# Patient Record
Sex: Female | Born: 1935 | Race: Black or African American | Hispanic: No | Smoking: Never smoker
Health system: Southern US, Community
[De-identification: ages and names within clinical notes are randomized; demographics above are authoritative.]

## PROBLEM LIST (undated history)

## (undated) DIAGNOSIS — J45909 Unspecified asthma, uncomplicated: Secondary | ICD-10-CM

## (undated) DIAGNOSIS — I10 Essential (primary) hypertension: Secondary | ICD-10-CM

## (undated) DIAGNOSIS — R413 Other amnesia: Secondary | ICD-10-CM

## (undated) DIAGNOSIS — F329 Major depressive disorder, single episode, unspecified: Secondary | ICD-10-CM

## (undated) DIAGNOSIS — F32A Depression, unspecified: Secondary | ICD-10-CM

## (undated) HISTORY — DX: Unspecified asthma, uncomplicated: J45.909

## (undated) HISTORY — DX: Major depressive disorder, single episode, unspecified: F32.9

## (undated) HISTORY — DX: Depression, unspecified: F32.A

## (undated) HISTORY — DX: Essential (primary) hypertension: I10

## (undated) HISTORY — PX: ABDOMINAL HYSTERECTOMY: SHX81

## (undated) HISTORY — DX: Other amnesia: R41.3

---

## 2005-10-15 ENCOUNTER — Emergency Department: Payer: Self-pay | Admitting: Emergency Medicine

## 2006-06-12 ENCOUNTER — Ambulatory Visit: Payer: Self-pay

## 2007-04-07 ENCOUNTER — Inpatient Hospital Stay: Payer: Self-pay | Admitting: Specialist

## 2007-04-07 ENCOUNTER — Other Ambulatory Visit: Payer: Self-pay

## 2007-04-22 ENCOUNTER — Ambulatory Visit: Payer: Self-pay | Admitting: Obstetrics and Gynecology

## 2008-04-24 ENCOUNTER — Ambulatory Visit: Payer: Self-pay | Admitting: Family Medicine

## 2009-03-16 ENCOUNTER — Emergency Department: Payer: Self-pay | Admitting: Internal Medicine

## 2009-04-26 ENCOUNTER — Ambulatory Visit: Payer: Self-pay | Admitting: Family Medicine

## 2009-08-16 ENCOUNTER — Emergency Department: Payer: Self-pay | Admitting: Unknown Physician Specialty

## 2010-04-28 ENCOUNTER — Ambulatory Visit: Payer: Self-pay | Admitting: Family Medicine

## 2011-01-02 ENCOUNTER — Emergency Department: Payer: Self-pay | Admitting: Emergency Medicine

## 2011-01-07 ENCOUNTER — Emergency Department: Payer: Self-pay

## 2011-05-27 ENCOUNTER — Ambulatory Visit: Payer: Self-pay | Admitting: Family Medicine

## 2011-07-14 HISTORY — PX: UPPER GI ENDOSCOPY: SHX6162

## 2011-07-14 HISTORY — PX: COLONOSCOPY: SHX174

## 2011-10-29 ENCOUNTER — Ambulatory Visit: Payer: Self-pay | Admitting: Unknown Physician Specialty

## 2011-11-03 LAB — PATHOLOGY REPORT

## 2012-02-12 ENCOUNTER — Emergency Department: Payer: Self-pay | Admitting: Emergency Medicine

## 2012-02-12 LAB — CBC
HGB: 11.2 g/dL — ABNORMAL LOW (ref 12.0–16.0)
MCH: 25.2 pg — ABNORMAL LOW (ref 26.0–34.0)
Platelet: 232 10*3/uL (ref 150–440)
RBC: 4.44 10*6/uL (ref 3.80–5.20)
WBC: 5.7 10*3/uL (ref 3.6–11.0)

## 2012-02-12 LAB — TROPONIN I: Troponin-I: <0.02 ng/mL

## 2012-02-12 LAB — BASIC METABOLIC PANEL
Anion Gap: 8 (ref 7–16)
BUN: 32 mg/dL — ABNORMAL HIGH (ref 7–18)
Calcium, Total: 9.9 mg/dL (ref 8.5–10.1)
Co2: 24 mmol/L (ref 21–32)
Creatinine: 1.24 mg/dL (ref 0.60–1.30)
EGFR (African American): 47 — ABNORMAL LOW
EGFR (Non-African Amer.): 40 — ABNORMAL LOW
Glucose: 80 mg/dL (ref 65–99)
Potassium: 4.3 mmol/L (ref 3.5–5.1)
Sodium: 141 mmol/L (ref 136–145)

## 2012-10-07 ENCOUNTER — Emergency Department: Payer: Self-pay | Admitting: Unknown Physician Specialty

## 2012-10-07 LAB — URINALYSIS, COMPLETE
Bacteria: NONE SEEN
Bilirubin,UR: NEGATIVE
Glucose,UR: NEGATIVE mg/dL (ref 0–75)
Leukocyte Esterase: NEGATIVE
Nitrite: NEGATIVE
Ph: 5 (ref 4.5–8.0)
Protein: 100
RBC,UR: 8 /HPF (ref 0–5)
Specific Gravity: 1.023 (ref 1.003–1.030)
Squamous Epithelial: 1
WBC UR: 3 /HPF (ref 0–5)

## 2012-10-07 LAB — COMPREHENSIVE METABOLIC PANEL
Albumin: 3 g/dL — ABNORMAL LOW (ref 3.4–5.0)
Alkaline Phosphatase: 168 U/L — ABNORMAL HIGH (ref 50–136)
Anion Gap: 7 (ref 7–16)
BUN: 14 mg/dL (ref 7–18)
Chloride: 105 mmol/L (ref 98–107)
Co2: 24 mmol/L (ref 21–32)
Creatinine: 1.1 mg/dL (ref 0.60–1.30)
EGFR (African American): 54 — ABNORMAL LOW
Glucose: 116 mg/dL — ABNORMAL HIGH (ref 65–99)
Osmolality: 273 (ref 275–301)
SGOT(AST): 42 U/L — ABNORMAL HIGH (ref 15–37)
SGPT (ALT): 43 U/L (ref 12–78)
Sodium: 136 mmol/L (ref 136–145)

## 2012-10-07 LAB — CK TOTAL AND CKMB (NOT AT ARMC): CK-MB: 1.1 ng/mL (ref 0.5–3.6)

## 2012-10-07 LAB — CBC
HCT: 34.1 % — ABNORMAL LOW (ref 35.0–47.0)
MCH: 25.6 pg — ABNORMAL LOW (ref 26.0–34.0)
MCHC: 32.3 g/dL (ref 32.0–36.0)
RDW: 13.9 % (ref 11.5–14.5)
WBC: 7.7 10*3/uL (ref 3.6–11.0)

## 2012-10-07 LAB — LIPASE, BLOOD: Lipase: 127 U/L (ref 73–393)

## 2012-10-07 LAB — TROPONIN I: Troponin-I: 0.04 ng/mL

## 2012-10-14 DIAGNOSIS — W19XXXA Unspecified fall, initial encounter: Secondary | ICD-10-CM | POA: Insufficient documentation

## 2012-10-14 DIAGNOSIS — E785 Hyperlipidemia, unspecified: Secondary | ICD-10-CM | POA: Insufficient documentation

## 2012-10-14 DIAGNOSIS — I10 Essential (primary) hypertension: Secondary | ICD-10-CM | POA: Insufficient documentation

## 2012-10-14 DIAGNOSIS — Z9889 Other specified postprocedural states: Secondary | ICD-10-CM | POA: Insufficient documentation

## 2012-10-14 DIAGNOSIS — M1711 Unilateral primary osteoarthritis, right knee: Secondary | ICD-10-CM | POA: Insufficient documentation

## 2012-11-23 ENCOUNTER — Emergency Department: Payer: Self-pay | Admitting: Internal Medicine

## 2013-02-17 ENCOUNTER — Ambulatory Visit: Payer: Self-pay | Admitting: Family Medicine

## 2013-04-12 HISTORY — PX: JOINT REPLACEMENT: SHX530

## 2013-04-13 ENCOUNTER — Ambulatory Visit: Payer: Self-pay | Admitting: General Practice

## 2013-04-13 LAB — URINALYSIS, COMPLETE
Bacteria: NONE SEEN
Bilirubin,UR: NEGATIVE
Blood: NEGATIVE
Glucose,UR: NEGATIVE mg/dL (ref 0–75)
Ketone: NEGATIVE
Leukocyte Esterase: NEGATIVE
Nitrite: NEGATIVE
Ph: 5 (ref 4.5–8.0)
Protein: NEGATIVE
Squamous Epithelial: 1

## 2013-04-13 LAB — APTT: Activated PTT: 42.6 secs — ABNORMAL HIGH (ref 23.6–35.9)

## 2013-04-13 LAB — BASIC METABOLIC PANEL
BUN: 22 mg/dL — ABNORMAL HIGH (ref 7–18)
Calcium, Total: 9.7 mg/dL (ref 8.5–10.1)
Chloride: 110 mmol/L — ABNORMAL HIGH (ref 98–107)
Creatinine: 1.09 mg/dL (ref 0.60–1.30)
EGFR (African American): 54 — ABNORMAL LOW
Osmolality: 284 (ref 275–301)
Sodium: 141 mmol/L (ref 136–145)

## 2013-04-13 LAB — CBC
HGB: 11.7 g/dL — ABNORMAL LOW (ref 12.0–16.0)
MCH: 25.2 pg — ABNORMAL LOW (ref 26.0–34.0)
MCHC: 32.3 g/dL (ref 32.0–36.0)
MCV: 78 fL — ABNORMAL LOW (ref 80–100)
Platelet: 213 10*3/uL (ref 150–440)
RDW: 15.9 % — ABNORMAL HIGH (ref 11.5–14.5)

## 2013-04-13 LAB — PROTIME-INR
INR: 1
Prothrombin Time: 13.5 secs (ref 11.5–14.7)

## 2013-04-14 LAB — URINE CULTURE

## 2013-04-26 ENCOUNTER — Inpatient Hospital Stay: Payer: Self-pay | Admitting: General Practice

## 2013-04-26 LAB — SEDIMENTATION RATE: Erythrocyte Sed Rate: 28 mm/hr (ref 0–30)

## 2013-04-27 LAB — BASIC METABOLIC PANEL
Anion Gap: 7 (ref 7–16)
BUN: 13 mg/dL (ref 7–18)
Calcium, Total: 8.5 mg/dL (ref 8.5–10.1)
Chloride: 112 mmol/L — ABNORMAL HIGH (ref 98–107)
Co2: 21 mmol/L (ref 21–32)
Creatinine: 0.98 mg/dL (ref 0.60–1.30)
EGFR (African American): >60
EGFR (Non-African Amer.): 53 — ABNORMAL LOW
Glucose: 97 mg/dL (ref 65–99)
Osmolality: 279 (ref 275–301)
Potassium: 3.9 mmol/L (ref 3.5–5.1)
Sodium: 140 mmol/L (ref 136–145)

## 2013-04-27 LAB — HEMOGLOBIN: HGB: 9.8 g/dL — ABNORMAL LOW (ref 12.0–16.0)

## 2013-04-27 LAB — PLATELET COUNT: Platelet: 177 10*3/uL (ref 150–440)

## 2013-04-28 LAB — BASIC METABOLIC PANEL
Anion Gap: 4 — ABNORMAL LOW (ref 7–16)
Calcium, Total: 8.8 mg/dL (ref 8.5–10.1)
Chloride: 111 mmol/L — ABNORMAL HIGH (ref 98–107)
Co2: 24 mmol/L (ref 21–32)
Potassium: 4.5 mmol/L (ref 3.5–5.1)
Sodium: 139 mmol/L (ref 136–145)

## 2013-04-28 LAB — HEMOGLOBIN: HGB: 9.1 g/dL — ABNORMAL LOW (ref 12.0–16.0)

## 2013-05-05 ENCOUNTER — Emergency Department: Payer: Self-pay | Admitting: Emergency Medicine

## 2013-05-05 LAB — COMPREHENSIVE METABOLIC PANEL
Albumin: 3 g/dL — ABNORMAL LOW (ref 3.4–5.0)
Alkaline Phosphatase: 122 U/L (ref 50–136)
Anion Gap: 7 (ref 7–16)
BUN: 27 mg/dL — ABNORMAL HIGH (ref 7–18)
Chloride: 102 mmol/L (ref 98–107)
EGFR (African American): 44 — ABNORMAL LOW
EGFR (Non-African Amer.): 38 — ABNORMAL LOW
Osmolality: 276 (ref 275–301)
Potassium: 4.6 mmol/L (ref 3.5–5.1)
SGOT(AST): 26 U/L (ref 15–37)
SGPT (ALT): 20 U/L (ref 12–78)
Total Protein: 7.6 g/dL (ref 6.4–8.2)

## 2013-05-05 LAB — CBC
HGB: 8.7 g/dL — ABNORMAL LOW (ref 12.0–16.0)
MCH: 25.3 pg — ABNORMAL LOW (ref 26.0–34.0)
MCV: 79 fL — ABNORMAL LOW (ref 80–100)
Platelet: 370 10*3/uL (ref 150–440)
RBC: 3.44 10*6/uL — ABNORMAL LOW (ref 3.80–5.20)
WBC: 8.7 10*3/uL (ref 3.6–11.0)

## 2013-05-05 LAB — CK TOTAL AND CKMB (NOT AT ARMC)
CK, Total: 95 U/L (ref 21–215)
CK-MB: 1.2 ng/mL (ref 0.5–3.6)

## 2013-05-05 LAB — TROPONIN I: Troponin-I: <0.02 ng/mL

## 2013-09-22 ENCOUNTER — Ambulatory Visit: Payer: Self-pay | Admitting: Unknown Physician Specialty

## 2014-01-02 DIAGNOSIS — M1611 Unilateral primary osteoarthritis, right hip: Secondary | ICD-10-CM | POA: Insufficient documentation

## 2014-01-17 DIAGNOSIS — M25551 Pain in right hip: Secondary | ICD-10-CM | POA: Insufficient documentation

## 2014-04-11 DIAGNOSIS — I10 Essential (primary) hypertension: Secondary | ICD-10-CM | POA: Insufficient documentation

## 2014-04-11 DIAGNOSIS — N183 Chronic kidney disease, stage 3 unspecified: Secondary | ICD-10-CM | POA: Insufficient documentation

## 2014-04-11 DIAGNOSIS — I38 Endocarditis, valve unspecified: Secondary | ICD-10-CM | POA: Insufficient documentation

## 2014-04-11 DIAGNOSIS — E782 Mixed hyperlipidemia: Secondary | ICD-10-CM | POA: Insufficient documentation

## 2014-06-20 DIAGNOSIS — Z862 Personal history of diseases of the blood and blood-forming organs and certain disorders involving the immune mechanism: Secondary | ICD-10-CM | POA: Insufficient documentation

## 2014-09-14 ENCOUNTER — Emergency Department: Payer: Self-pay | Admitting: Emergency Medicine

## 2014-11-02 NOTE — Discharge Summary (Signed)
PATIENT NAME:  [name], [name] MR#:  [id number] DATE OF BIRTH:  04-03-1927  DATE OF ADMISSION:  04/26/2013 DATE OF DISCHARGE:    ADMITTING DIAGNOSIS: Degenerative arthrosis of the right knee.   DISCHARGE DIAGNOSIS: Degenerative arthrosis of the right knee.   HISTORY OF PRESENT ILLNESS:  The patient is an 81 year old female who has been followed at [facility] for progression of right knee discomfort. She had reported a several year history of progressive right knee pain. She did have a remote history of right knee arthroscopy with modest improvement of her symptoms. However, she has had increased pain especially along the lateral aspect of the knee. At the time of surgery, she was using a wheeled walker for ambulation due to the severity of pain. The patient had noticed progressive valgus deformity of the knee. She had not seen any significant improvement in her condition despite the use of anti-inflammatory medications, intra-articular cortisone injections as well as Synvisc injections. The patient states that the pain had increased to the point that it was significantly interfering with her activities of daily living. X-rays taken in [facility] showed narrowing of the lateral cartilage space with bone-on-bone articulation noted as well as being associated with valgus deformity. She was noted to have osteophyte as well as subchondral sclerosis. After discussion of the risks and benefits of surgical intervention, the patient expressed her understanding of the risks and benefits and agreed for plans for surgical intervention.   PROCEDURE: Right total knee arthroplasty using computer-assisted navigation.   ANESTHESIA: Spinal.   SOFT TISSUE RELEASE: Anterior cruciate ligament, posterior cruciate ligament, deep medial collateral ligaments, patellofemoral ligament as well as the posterolateral corner and pie crusting of the iliotibial band.   IMPLANTS UTILIZED: DePuy PFC Sigma size 2.5 posterior  stabilized femoral component (cemented), size 2.5 MBT tibial component (cemented), 35 mm 3 pegged oval dome patella (cemented), and a 10 mm stabilized rotating platform polyethylene insert.   HOSPITAL COURSE: The patient tolerated the procedure very well. She had no complications. She was then taken to the PAC-U where she was stabilized and then transferred to the orthopedic floor. The patient began receiving anticoagulation therapy of Lovenox 30 mg subcutaneous every 12 hours per anesthesia and pharmacy protocol. She was fitted with TED stockings bilaterally. These were allowed to be removed 1 hour per 8 hour shift. The right one was applied on day 2 following removal of the Hemovac and dressing change. The patient was also fitted with the AVI compression foot pumps bilaterally set at 80 mmHg. Her calves have been nontender. There has been no evidence of any DVTs. Negative Homans sign. Heels were elevated off the bed using rolled towels.   Physical therapy was initiated on day 1 for gait training and transfers. She was doing very well. She had no pain but ambulation was decreased due to conditioning. She had good range of motion. Occupational therapy was also initiated on day 1 for ADL and assistive devices.   The patient's vital signs have been stable. She has been afebrile. Hemodynamically she was stable. No transfusions were given other than the Autovac transfusions given the first 6 hours postoperatively.   The patient's IV, Foley and Hemovac were discontinued on day 2 along with a dressing change. The Polar Care was reapplied to the surgical leg, maintaining a temperature of 40 to 50 degrees Fahrenheit. Overall, she has done very well. There has been no hospital complications.   DISPOSITION: The patient is being discharged to skilled nursing facility  in improved stable condition.   DISCHARGE INSTRUCTIONS:  She may weight bear as tolerated. Continue using a walker until cleared by physical therapy  to go to a quad cane. Elevate the heels off the bed. Continue with thigh-high TED stockings bilaterally. These are allowed to be removed 1 hour per 8 hour shift. Encourage cough, deep breathing every 2 hours while awake. Incentive spirometer q.1 hour while awake. Continue Polar Care to the surgical leg, maintaining a temperature of 40 to 50 degrees Fahrenheit.  Change dressing as needed. She is not to take a shower until the staples are removed. She has a follow-up appointment on 10/30 at 9:15. For temperatures greater than 101.5 she is to take Tylenol. Call the clinic if any bright red bleeding from the incision or wound.   DRUG ALLERGIES: ASPIRIN.   MEDICATIONS:  Norvasc 5 mg daily, Celebrex 200 mg b.i.d., vitamin B12 1000 mcg daily, Dulcolax suppositories 10 mg p.r.n. if no results then milk of magnesia or Dulcolax, Senokot-S 1 tablet b.i.d., Flonase nasal spray 2 sprays both nostrils daily, pantoprazole 40 mg daily, Pravachol 40 mg at bedtime, Tylenol 500 to 1000 mg to 4 to 6 hours p.r.n. for temperatures greater than 100.4 or pain. milk of magnesia 30 mL b.i.d., oxycodone 5 to 10 mg every 4 to 6 hours p.r.n. for pain, Ultram 50 to 100 mg every 4 to 6 hours p.r.n. p.r.n. for pain. Enema soapsuds if no results with milk of magnesia or Dulcolax.   PAST MEDICAL HISTORY: 1.  Hyperlipidemia.  2.  Hypertension.  3.  Kidney stones.  4.  Iron deficiency anemia.  5.  Depression.  6.  NSTEMI.   ____________________________ Vance Peper, PA [id number] D: 04/28/2013 08:17:23 ET T: 04/28/2013 08:57:04 ET JOB#: [id number]  cc: Vance Peper, PA, <Dictator> Tracy Gerken PA ELECTRONICALLY SIGNED 05/09/2013 8:23

## 2014-11-02 NOTE — Op Note (Signed)
PATIENT NAME:  [name], [name] MR#:  [id number] DATE OF BIRTH:  15-Oct-1926  DATE OF PROCEDURE:  04/26/2013  PREOPERATIVE DIAGNOSIS: Degenerative arthrosis of the right knee.   POSTOPERATIVE DIAGNOSIS: Degenerative arthrosis of the right knee.   PROCEDURE PERFORMED: Right total knee arthroplasty using computer-assisted navigation.   SURGEON: Skip Estimable, M.D.   ASSISTANT: Vance Peper, P.A., (required to maintain retraction throughout the procedure).   ANESTHESIA: Spinal.   ESTIMATED BLOOD LOSS: 50 mL.   FLUIDS REPLACED: 2000 mL of crystalloid.   TOURNIQUET TIME: 87 minutes.   DRAINS: Two medium drains to reinfusion system.   SOFT TISSUE RELEASES: Anterior cruciate ligament, posterior cruciate ligament, deep medial collateral ligament, patellofemoral ligament, posterolateral corner and pie crusting of the iliotibial band.   IMPLANTS UTILIZED: DePuy PFC Sigma size 2.5 posterior stabilized femoral component (cemented), size 2.5 MBT tibial component (cemented), 35 mm three-peg oval dome patella (cemented), and a 10 mm stabilized rotating platform polyethylene insert.   INDICATIONS FOR SURGERY: The patient is an 81 year old female who has been seen for complaints of progressive right knee pain. X-rays demonstrated severe degenerative changes in tricompartmental fashion with severe valgus deformity. After discussion of the risks and benefits of surgical intervention, the patient expressed understanding of the risks and benefits and agreed with plans for surgical intervention.   PROCEDURE IN DETAIL: The patient was brought into the operating room and, after adequate spinal anesthesia was achieved, a tourniquet was placed on the patient's upper right thigh. The patient's right knee and leg were cleaned and prepped with alcohol and DuraPrep, draped in the usual sterile fashion. A "timeout" was performed as per usual protocol. The right lower extremity was exsanguinated using an Esmarch, and the  tourniquet was inflated to 300 mmHg.   An anterior longitudinal incision was made followed by a standard mid vastus approach. A large effusion was evacuated. The deep fibers of the medial collateral ligament were elevated in a subperiosteal fashion off the medial flare of the tibia so as to maintain a continuous soft-tissue sleeve. The patella was subluxed laterally and the patellofemoral ligament was incised. Inspection of the knee demonstrated severe degenerative changes in tricompartmental fashion with full-thickness loss of articular cartilage to the lateral compartment and erosion to the lateral tibial plateau. Prominent osteophytes were debrided using a rongeur. Anterior and posterior cruciate ligaments were excised.   Two 4.0 mm Schanz pins were inserted into the femur and into the tibia for attachment of the array of trackers used for computer-assisted navigation. Hip center was identified using circumduction technique. Distal landmarks were mapped using the computer. The distal femur and proximal tibia were mapped using the computer. Distal femoral cutting guide was positioned using computer-assisted navigation so as to achieve a 5-degree distal valgus cut. Cut was performed and verified using the computer.   Distal femur was sized and it was felt that a size 2.5 femoral component was appropriate. A size 2.5 cutting guide was positioned and the anterior cut was performed and verified using the computer. This was followed by completion of the posterior and chamfer cuts.   Attention was then directed to the proximal tibia. Medial and lateral menisci were excised. The extramedullary tibial cutting guide was positioned using computer-assisted navigation so as to achieve a 0-degree varus valgus alignment and a 0-degree posterior slope. Cut was performed and verified using the computer.   The proximal tibia was sized and it was felt that the size 2.5 tibial tray was appropriate. Tibial and femoral  trials  were inserted followed by insertion of a 10 mm polyethylene insert (trial). The knee was felt to be tight laterally. Trial component was removed and the knee was brought in full extension and distracted using Moreland retractors. The posterolateral corner was carefully released using a combination of electrocautery and Metzenbaum scissors. This allowed for improved medial and lateral soft-tissue balancing. Trial components were reinserted. IT band was still noted to be extremely tight, and pie crusting of the IT band was performed using the scalpel. This allowed for excellent mediolateral soft-tissue balancing both in full extension and in flexion.   Finally, the patella was cut and prepared so as to accommodate a 35 mm three-peg oval dome patella. Patellar trial was placed and the knee was placed through a range of motion with excellent patellar tracking appreciated. The femoral trial was removed after debridement of posterior osteophytes. Central post hole for the tibial component was reamed followed by insertion of a keel punch. Tibial tray was then removed. Cut surfaces of bone were irrigated with copious amounts of normal saline with antibiotic solution using pulsatile lavage and suctioned dry.   Polymethyl methacrylate cement was prepared in the usual fashion using a vacuum mixer. Cement was applied to the cut surface of the proximal tibia as well as along the undersurface of a size 2.5 MBT tibial component. The tibial component was positioned and impacted into place. Excess cement was removed using [employer] fast streamer. Cement was then applied to the cut surface of the femur as well as along the posterior flanges of a size 2.5 posterior stabilized femoral component. Femoral component was positioned and impacted into place. Excess cement was removed using [employer] fast streamer. A 10 mm polyethylene trial was inserted and the knee was brought in full extension with steady axial compression applied.   Finally,  cement was applied to the backside of a 35 mm three-peg oval dome patella, and the patellar component was positioned and patellar clamp applied. Excess cement was removed using [employer] fast streamer. After adequate curing of cement, the tourniquet was deflated after total tourniquet time of 87 minutes. Hemostasis was achieved using electrocautery. The knee was irrigated with copious amounts of normal saline with antibiotic solution using pulsatile lavage and then suctioned dry. The knee was evaluated for any cement debris, and 20 mL of 1.3% Exparel in 40 mL of normal saline was then injected on the posterior capsule, medial and lateral gutters, and along the arthrotomy site. A 10 mm stabilized rotating platform polyethylene insert was inserted and the knee was placed through a range of motion with excellent patellar tracking appreciated and excellent mediolateral soft-tissue balancing noted. Two medium drains were placed in the wound bed and brought out through a separate stab incision to be attached to a reinfusion system. The medial parapatellar portion of the incision was reapproximated using interrupted sutures of #1 Vicryl. The subcutaneous tissue was approximated in layers using first #0 Vicryl followed by 2-0 Vicryl. Subcutaneous tissue along the incision site was then injected with 30 mL of 0.25% Marcaine with epinephrine. Skin was closed with skin staples. A sterile dressing was applied.   The patient tolerated the procedure well. She was transported to the recovery room in stable condition.   ____________________________ Laurice Record. Holley Bouche., MD jph:np D: 04/26/2013 33:82:50 ET T: 04/26/2013 20:52:57 ET JOB#: [id number]  cc: Laurice Record. Holley Bouche., MD, <Dictator> JAMES P Holley Bouche MD ELECTRONICALLY SIGNED 05/02/2013 22:34

## 2014-11-29 DIAGNOSIS — F32A Depression, unspecified: Secondary | ICD-10-CM | POA: Insufficient documentation

## 2014-11-29 DIAGNOSIS — F329 Major depressive disorder, single episode, unspecified: Secondary | ICD-10-CM | POA: Insufficient documentation

## 2014-11-29 DIAGNOSIS — J3089 Other allergic rhinitis: Secondary | ICD-10-CM | POA: Insufficient documentation

## 2014-12-01 DIAGNOSIS — E538 Deficiency of other specified B group vitamins: Secondary | ICD-10-CM | POA: Insufficient documentation

## 2014-12-01 DIAGNOSIS — J4520 Mild intermittent asthma, uncomplicated: Secondary | ICD-10-CM | POA: Insufficient documentation

## 2014-12-01 DIAGNOSIS — D649 Anemia, unspecified: Secondary | ICD-10-CM | POA: Insufficient documentation

## 2015-04-03 ENCOUNTER — Emergency Department
Admission: EM | Admit: 2015-04-03 | Discharge: 2015-04-03 | Disposition: A | Discharge status: Home / Self Care | Payer: Commercial Managed Care - HMO | Attending: Emergency Medicine | Admitting: Emergency Medicine

## 2015-04-03 ENCOUNTER — Emergency Department: Payer: Commercial Managed Care - HMO

## 2015-04-03 ENCOUNTER — Encounter: Payer: Self-pay | Admitting: Emergency Medicine

## 2015-04-03 DIAGNOSIS — Y998 Other external cause status: Secondary | ICD-10-CM | POA: Insufficient documentation

## 2015-04-03 DIAGNOSIS — Y9289 Other specified places as the place of occurrence of the external cause: Secondary | ICD-10-CM | POA: Insufficient documentation

## 2015-04-03 DIAGNOSIS — S6992XA Unspecified injury of left wrist, hand and finger(s), initial encounter: Secondary | ICD-10-CM | POA: Diagnosis present

## 2015-04-03 DIAGNOSIS — Z79899 Other long term (current) drug therapy: Secondary | ICD-10-CM | POA: Diagnosis not present

## 2015-04-03 DIAGNOSIS — Y9389 Activity, other specified: Secondary | ICD-10-CM | POA: Insufficient documentation

## 2015-04-03 DIAGNOSIS — W07XXXA Fall from chair, initial encounter: Secondary | ICD-10-CM | POA: Diagnosis not present

## 2015-04-03 DIAGNOSIS — S63611A Unspecified sprain of left index finger, initial encounter: Secondary | ICD-10-CM | POA: Insufficient documentation

## 2015-04-03 MED ORDER — TRAMADOL HCL 50 MG PO TABS
50.0000 mg | ORAL_TABLET | Freq: Once | ORAL | Status: AC
  Administered 2015-04-03: 50 mg via ORAL
  Filled 2015-04-03: qty 1

## 2015-04-03 MED ORDER — TRAMADOL HCL 50 MG PO TABS: 50.0000 mg | ORAL_TABLET | Freq: Two times a day (BID) | ORAL | Status: DC | PRN

## 2015-04-03 NOTE — ED Notes (Signed)
Patient to ER for pain to left hand. Denies any injury.

## 2015-04-03 NOTE — ED Notes (Signed)
Woke up this am with pain and swelling to left hand   Swelling mainly at 3rd digit.Renee Kitchenunsure of injury

## 2015-04-03 NOTE — Discharge Instructions (Signed)
Wear splint for 3-5 days Finger Sprain A finger sprain happens when the bands of tissue that hold the finger bones together (ligaments) stretch too much and tear. HOME CARE  Keep your injured finger raised (elevated) when possible.  Put ice on the injured area, twice a day, for 2 to 3 days.  Put ice in a plastic bag.  Place a towel between your skin and the bag.  Leave the ice on for 15 minutes.  Only take medicine as told by your doctor.  Do not wear rings on the injured finger.  Protect your finger until pain and stiffness go away (usually 3 to 4 weeks).  Do not get your cast or splint to get wet. Cover your cast or splint with a plastic bag when you shower or bathe. Do not swim.  Your doctor may suggest special exercises for you to do. These exercises will help keep or stop stiffness from happening. GET HELP RIGHT AWAY IF:  Your cast or splint gets damaged.  Your pain gets worse, not better. MAKE SURE YOU:  Understand these instructions.  Will watch your condition.  Will get help right away if you are not doing well or get worse. Document Released: 08/01/2010 Document Revised: 09/21/2011 Document Reviewed: 03/02/2011 [facility] Patient Information 2015 [place], [place]. This information is not intended to replace advice given to you by your health care provider. Make sure you discuss any questions you have with your health care provider.

## 2015-04-03 NOTE — ED Provider Notes (Signed)
[facility] Emergency Department Provider Note  ____________________________________________  Time seen: Approximately 1:22 PM  I have reviewed the triage vital signs and the nursing notes.   HISTORY  Chief Complaint Hand Pain    HPI [name] is a 81 y.o. female pain and edema to the second digit left hand secondary to a fall 3 days ago. Patient states she was attempted to come out of a chair and is negative away from her and she fell and hit her hand against the wall. Patient state it was painful with no edema until this morning. Patient stated decreased range of motion with flexion.   History reviewed. No pertinent past medical history.  There are no active problems to display for this patient.   Past Surgical History  Procedure Laterality Date  . Total knee arthroplasty Right   . Abdominal hysterectomy      Current Outpatient Rx  Name  Route  Sig  Dispense  Refill  . amLODipine (NORVASC) 5 MG tablet   Oral   Take 5 mg by mouth daily.         . IRON PO   Oral   Take 65 mg by mouth every morning.         . pravastatin (PRAVACHOL) 40 MG tablet   Oral   Take 40 mg by mouth daily.         . sertraline (ZOLOFT) 50 MG tablet   Oral   Take 50 mg by mouth daily.         . vitamin B-12 (CYANOCOBALAMIN) 500 MCG tablet   Oral   Take 500 mcg by mouth daily.         . traMADol (ULTRAM) 50 MG tablet   Oral   Take 1 tablet (50 mg total) by mouth every 12 (twelve) hours as needed for moderate pain.   12 tablet   0     Allergies Aspirin and Motrin  No family history on file.  Social History Social History  Substance Use Topics  . Smoking status: Never Smoker   . Smokeless tobacco: None  . Alcohol Use: No    Review of Systems Constitutional: No fever/chills Eyes: No visual changes. ENT: No sore throat. Cardiovascular: Denies chest pain. Respiratory: Denies shortness of breath. Gastrointestinal: No abdominal pain.   No nausea, no vomiting.  No diarrhea.  No constipation. Genitourinary: Negative for dysuria. Musculoskeletal:l Skin: Negative for rash. Neurological: Negative for headaches, focal weakness or numbness. 10-point ROS otherwise negative.  ____________________________________________   PHYSICAL EXAM:  VITAL SIGNS: ED Triage Vitals  Enc Vitals Group     BP 04/03/15 1242 169/95 mmHg     Pulse Rate 04/03/15 1242 75     Resp 04/03/15 1242 18     Temp 04/03/15 1242 98.4 F (36.9 C)     Temp Source 04/03/15 1242 Oral     SpO2 04/03/15 1242 96 %     Weight 04/03/15 1242 135 lb (61.236 kg)     Height 04/03/15 1242 5\' 3"  (1.6 m)     Head Cir --      Peak Flow --      Pain Score 04/03/15 1242 10     Pain Loc --      Pain Edu? --      Excl. in [place]? --     Constitutional: Alert and oriented. Well appearing and in no acute distress. Eyes: Conjunctivae are normal. PERRL. EOMI. Head: Atraumatic. Nose: No congestion/rhinnorhea. Mouth/Throat:  Mucous membranes are moist.  Oropharynx non-erythematous. Neck: No stridor.   Hematological/Lymphatic/Immunilogical: No cervical lymphadenopathy. Cardiovascular: Normal rate, regular rhythm. Grossly normal heart sounds.  Good peripheral circulation. Elevated blood pressure Respiratory: Normal respiratory effort.  No retractions. Lungs CTAB. Gastrointestinal: Soft and nontender. No distention. No abdominal bruits. No CVA tenderness. Musculoskeletal:  edema to the left index finger. Neurologic:  Normal speech and language. No gross focal neurologic deficits are appreciated. No gait instability. Skin:  Skin is warm, dry and intact. No rash noted. Psychiatric: Mood and affect are normal. Speech and behavior are normal.  ____________________________________________   LABS (all labs ordered are listed, but only abnormal results are displayed)  Labs Reviewed - No data to  display ____________________________________________  EKG   ____________________________________________  RADIOLOGY  No fractures moderate soft tissue edema. ____________________________________________   PROCEDURES  Procedure(s) performed: None  Critical Care performed: No  ____________________________________________   INITIAL IMPRESSION / ASSESSMENT AND PLAN / ED COURSE  Pertinent labs & imaging results that were available during my care of the patient were reviewed by me and considered in my medical decision making (see chart for details).  Sprain left index finger. Patient was placed in a splint. Patient advised on supportive care. Patient given prescription for tramadol to take as needed for pain. Patient advised follow-up family doctor in 3-5 days return back to ER if condition worsens. ____________________________________________   FINAL CLINICAL IMPRESSION(S) / ED DIAGNOSES  Final diagnoses:  Sprain of left index finger, initial encounter      Sable Feil, PA-C 04/03/15 1417  Nance Pear, MD 04/03/15 [phone]

## 2015-06-10 DIAGNOSIS — K219 Gastro-esophageal reflux disease without esophagitis: Secondary | ICD-10-CM | POA: Insufficient documentation

## 2015-06-10 DIAGNOSIS — J302 Other seasonal allergic rhinitis: Secondary | ICD-10-CM | POA: Insufficient documentation

## 2015-06-10 DIAGNOSIS — M19012 Primary osteoarthritis, left shoulder: Secondary | ICD-10-CM | POA: Insufficient documentation

## 2015-10-01 DIAGNOSIS — I10 Essential (primary) hypertension: Secondary | ICD-10-CM | POA: Insufficient documentation

## 2015-10-01 DIAGNOSIS — F325 Major depressive disorder, single episode, in full remission: Secondary | ICD-10-CM | POA: Insufficient documentation

## 2015-12-13 DIAGNOSIS — R2689 Other abnormalities of gait and mobility: Secondary | ICD-10-CM | POA: Insufficient documentation

## 2015-12-13 DIAGNOSIS — J45909 Unspecified asthma, uncomplicated: Secondary | ICD-10-CM | POA: Insufficient documentation

## 2016-03-24 ENCOUNTER — Ambulatory Visit: Payer: Self-pay | Admitting: Urology

## 2016-03-24 ENCOUNTER — Encounter: Payer: Self-pay | Admitting: Urology

## 2016-03-24 DIAGNOSIS — B3749 Other urogenital candidiasis: Secondary | ICD-10-CM | POA: Insufficient documentation

## 2016-04-08 ENCOUNTER — Ambulatory Visit (INDEPENDENT_AMBULATORY_CARE_PROVIDER_SITE_OTHER): Payer: Commercial Managed Care - HMO | Admitting: Urology

## 2016-04-08 ENCOUNTER — Encounter: Payer: Self-pay | Admitting: Urology

## 2016-04-08 VITALS — BP 117/75 | HR 69 | Ht 63.0 in | Wt 127.0 lb

## 2016-04-08 DIAGNOSIS — R3129 Other microscopic hematuria: Secondary | ICD-10-CM | POA: Diagnosis not present

## 2016-04-08 DIAGNOSIS — R634 Abnormal weight loss: Secondary | ICD-10-CM | POA: Diagnosis not present

## 2016-04-08 DIAGNOSIS — Z86010 Personal history of colonic polyps: Secondary | ICD-10-CM | POA: Diagnosis not present

## 2016-04-08 DIAGNOSIS — D649 Anemia, unspecified: Secondary | ICD-10-CM

## 2016-04-08 LAB — URINALYSIS, COMPLETE
Bilirubin, UA: NEGATIVE
Glucose, UA: NEGATIVE
Ketones, UA: NEGATIVE
NITRITE UA: NEGATIVE
Specific Gravity, UA: 1.025 (ref 1.005–1.030)
Urobilinogen, Ur: 0.2 mg/dL (ref 0.2–1.0)
pH, UA: 5 (ref 5.0–7.5)

## 2016-04-08 LAB — MICROSCOPIC EXAMINATION: WBC, UA: >30 /hpf — AB (ref 0–?)

## 2016-04-08 NOTE — Patient Instructions (Addendum)
Hematuria, Adult Hematuria is blood in your urine. It can be caused by a bladder infection, kidney infection, prostate infection, kidney stone, or cancer of your urinary tract. Infections can usually be treated with medicine, and a kidney stone usually will pass through your urine. If neither of these is the cause of your hematuria, further workup to find out the reason may be needed. It is very important that you tell your health care provider about any blood you see in your urine, even if the blood stops without treatment or happens without causing pain. Blood in your urine that happens and then stops and then happens again can be a symptom of a very serious condition. Also, pain is not a symptom in the initial stages of many urinary cancers. HOME CARE INSTRUCTIONS   Drink lots of fluid, 3-4 quarts a day. If you have been diagnosed with an infection, cranberry juice is especially recommended, in addition to large amounts of water.  Avoid caffeine, tea, and carbonated beverages because they tend to irritate the bladder.  Avoid alcohol because it may irritate the prostate.  Take all medicines as directed by your health care provider.  If you were prescribed an antibiotic medicine, finish it all even if you start to feel better.  If you have been diagnosed with a kidney stone, follow your health care provider's instructions regarding straining your urine to catch the stone.  Empty your bladder often. Avoid holding urine for long periods of time.  After a bowel movement, women should cleanse front to back. Use each tissue only once.  Empty your bladder before and after sexual intercourse if you are a female. SEEK MEDICAL CARE IF:  You develop back pain.  You have a fever.  You have a feeling of sickness in your stomach (nausea) or vomiting.  Your symptoms are not better in 3 days. Return sooner if you are getting worse. SEEK IMMEDIATE MEDICAL CARE IF:   You develop severe vomiting and  are unable to keep the medicine down.  You develop severe back or abdominal pain despite taking your medicines.  You begin passing a large amount of blood or clots in your urine.  You feel extremely weak or faint, or you pass out. MAKE SURE YOU:   Understand these instructions.  Will watch your condition.  Will get help right away if you are not doing well or get worse.   This information is not intended to replace advice given to you by your health care provider. Make sure you discuss any questions you have with your health care provider.   Document Released: 06/29/2005 Document Revised: 07/20/2014 Document Reviewed: 02/27/2013 Elsevier Interactive Patient Education 2016 [place] Scan A computed tomography (CT) scan is a specialized X-ray scan. It uses X-rays and a computer to make pictures of different areas of your body. A CT scan can offer more detailed information than a regular X-ray exam. The CT scan provides data about internal organs, soft tissue structures, blood vessels, and bones.  The CT scanner is a large machine that takes pictures of your body as you move through the opening.  LET [facility] CARE PROVIDER KNOW ABOUT:  Any allergies you have.   All medicines you are taking, including vitamins, herbs, eye drops, creams, and over-the-counter medicines.   Previous problems you or members of your family have had with the use of anesthetics.   Any blood disorders you have.   Previous surgeries you have had.   Medical  conditions you have. RISKS AND COMPLICATIONS  Generally, this is a safe procedure. However, as with any procedure, problems can occur. Possible problems include:   An allergic reaction to the contrast material.   Development of cancer from excessive exposure to radiation. The risk of this is small.  BEFORE THE PROCEDURE   The day before the test, stop drinking caffeinated beverages. These include energy drinks, tea, soda, coffee,  and hot chocolate.   On the day of the test:  About 4 hours before the test, stop eating and drinking anything but water as advised by your health care provider.   Avoid wearing jewelry. You will have to partly or fully undress and wear a hospital gown. PROCEDURE   You will be asked to lie on a table with your arms above your head.   If contrast dye is to be used for the test, an IV tube will be inserted in your arm. The contrast dye will be injected into the IV tube. You might feel warm, or you may get a metallic taste in your mouth.   The table you will be lying on will move into a large machine that will do the scanning.   You will be able to see, hear, and talk to the person running the machine while you are in it. Follow that person's directions.   The CT machine will move around you to take pictures. Do not move while it is scanning. This helps to get a good image.   When the best possible pictures have been taken, the machine will be turned off. The table will be moved out of the machine. The IV tube will then be removed. AFTER THE PROCEDURE  Ask your health care provider when to follow up for your test results.   This information is not intended to replace advice given to you by your health care provider. Make sure you discuss any questions you have with your health care provider.   Document Released: 08/06/2004 Document Revised: 07/04/2013 Document Reviewed: 03/06/2013 Elsevier Interactive Patient Education [employer].  Cystoscopy Cystoscopy is a procedure that is used to help your caregiver diagnose and sometimes treat conditions that affect your lower urinary tract. Your lower urinary tract includes your bladder and the tube through which urine passes from your bladder out of your body (urethra). Cystoscopy is performed with a thin, tube-shaped instrument (cystoscope). The cystoscope has lenses and a light at the end so that your caregiver can see inside your  bladder. The cystoscope is inserted at the entrance of your urethra. Your caregiver guides it through your urethra and into your bladder. There are two main types of cystoscopy:  Flexible cystoscopy (with a flexible cystoscope).  Rigid cystoscopy (with a rigid cystoscope). Cystoscopy may be recommended for many conditions, including:  Urinary tract infections.  Blood in your urine (hematuria).  Loss of bladder control (urinary incontinence) or overactive bladder.  Unusual cells found in a urine sample.  Urinary blockage.  Painful urination. Cystoscopy may also be done to remove a sample of your tissue to be checked under a microscope (biopsy). It may also be done to remove or destroy bladder stones. LET YOUR CAREGIVER KNOW ABOUT:  Allergies to food or medicine.  Medicines taken, including vitamins, herbs, eyedrops, over-the-counter medicines, and creams.  Use of steroids (by mouth or creams).  Previous problems with anesthetics or numbing medicines.  History of bleeding problems or blood clots.  Previous surgery.  Other health problems, including diabetes and  kidney problems.  Possibility of pregnancy, if this applies. PROCEDURE The area around the opening to your urethra will be cleaned. A medicine to numb your urethra (local anesthetic) is used. If a tissue sample or stone is removed during the procedure, you may be given a medicine to make you sleep (general anesthetic). Your caregiver will gently insert the tip of the cystoscope into your urethra. The cystoscope will be slowly glided through your urethra and into your bladder. Sterile fluid will flow through the cystoscope and into your bladder. The fluid will expand and stretch your bladder. This gives your caregiver a better view of your bladder walls. The procedure lasts about 15-20 minutes. AFTER THE PROCEDURE If a local anesthetic is used, you will be allowed to go home as soon as you are ready. If a general  anesthetic is used, you will be taken to a recovery area until you are stable. You may have temporary bleeding and burning on urination.   This information is not intended to replace advice given to you by your health care provider. Make sure you discuss any questions you have with your health care provider.   Document Released: 06/26/2000 Document Revised: 07/20/2014 Document Reviewed: 12/21/2011 Elsevier Interactive Patient Education [employer].

## 2016-04-08 NOTE — Progress Notes (Signed)
04/08/2016 8:26 AM   [name] [birth year] [id number]  Referring provider: Glendon Axe, MD [facility] [place], [place] [address]  Chief complaint Microscopic hematuria  HPI: Patient is a 82 -year-old [place] American who presents today as a referral from their PCP, Dr. Candiss Norse, for microscopic hematuria.  Patient was found to have microscopic hematuria on 02/27/2016 with 10-50 RBC's/hpf.  Patient doesn't have a prior history of microscopic hematuria.    She does not have a prior history of recurrent urinary tract infections, nephrolithiasis, trauma to the genitourinary tract or malignancies of the genitourinary tract.  She does not have a family medical history of nephrolithiasis, malignancies of the genitourinary tract or hematuria.   Today, she is not having symptoms of frequent urination, urgency, dysuria, nocturia, hesitancy, intermittency, straining to urinate or a weak urinary stream.  Her UA today demonstrates >30 RBC's and > 30 WBC's.  She does wear depends for incontinence.    She has lost weight and is constipated.    She is not experiencing any suprapubic pain, abdominal pain or flank pain.  She denies any recent fevers, chills, nausea or vomiting.   She did have a non contrast CT performed on 09/22/2013 which noted a 4 cm intraluminal soft tissue density mass in the ascending colon, suspicious for a colonic neoplasm.  Patient could not clarify if any follow up was done  She is not a smoker.  She is not exposed to secondhand smoke.  She has not worked with Sports [employer].    PMH: Past Medical History:  Diagnosis Date  . Hypertension     Surgical History: Past Surgical History:  Procedure Laterality Date  . ABDOMINAL HYSTERECTOMY    . TOTAL KNEE ARTHROPLASTY Right     Home Medications:    Medication List       Accurate as of 04/08/16 11:59 PM. Always use your most recent med list.          amLODipine 5 MG  tablet Commonly known as:  NORVASC Take 5 mg by mouth daily.   IRON PO Take 65 mg by mouth every morning.   pravastatin 40 MG tablet Commonly known as:  PRAVACHOL Take 40 mg by mouth daily.   sertraline 50 MG tablet Commonly known as:  ZOLOFT Take 50 mg by mouth daily.   traMADol 50 MG tablet Commonly known as:  ULTRAM Take 1 tablet (50 mg total) by mouth every 12 (twelve) hours as needed for moderate pain.   vitamin B-12 500 MCG tablet Commonly known as:  CYANOCOBALAMIN Take 500 mcg by mouth daily.       Allergies:  Allergies  Allergen Reactions  . Aspirin Nausea And Vomiting  . Motrin [Ibuprofen] Nausea And Vomiting    Family History: Family History  Problem Relation Age of Onset  . Kidney disease Father   . Heart failure Mother   . Kidney disease Sister   . Bladder Cancer Neg Hx   . Kidney cancer Neg Hx     Social History:  reports that she has never smoked. She has never used smokeless tobacco. She reports that she does not drink alcohol. Her drug history is not on file.  ROS: UROLOGY Frequent Urination?: No Hard to postpone urination?: No Burning/pain with urination?: No Get up at night to urinate?: No Leakage of urine?: No Urine stream starts and stops?: No Trouble starting stream?: No Do you have to strain to urinate?: No Blood in urine?: No  Urinary tract infection?: No Sexually transmitted disease?: No Injury to kidneys or bladder?: No Painful intercourse?: No Weak stream?: No Currently pregnant?: No Vaginal bleeding?: No Last menstrual period?: n  Gastrointestinal Nausea?: No Vomiting?: No Indigestion/heartburn?: No Diarrhea?: No Constipation?: No  Constitutional Fever: No Night sweats?: No Weight loss?: No Fatigue?: No  Skin Skin rash/lesions?: No Itching?: No  Eyes Blurred vision?: No Double vision?: No  Ears/Nose/Throat Sore throat?: No Sinus problems?: No  Hematologic/Lymphatic Swollen glands?: No Easy bruising?:  No  Cardiovascular Leg swelling?: No Chest pain?: No  Respiratory Cough?: No Shortness of breath?: No  Endocrine Excessive thirst?: No  Musculoskeletal Back pain?: No Joint pain?: No  Neurological Headaches?: No Dizziness?: No  Psychologic Depression?: No Anxiety?: No  Physical Exam: BP 117/75 (BP Location: Left Arm, Patient Position: Sitting, Cuff Size: Normal)   Pulse 69   Ht 5\' 3"  (1.6 m)   Wt 127 lb (57.6 kg)   BMI 22.50 kg/m   Constitutional: Well nourished. Alert and oriented, No acute distress. HEENT: [place] AT, moist mucus membranes. Trachea midline, no masses. Cardiovascular: No clubbing, cyanosis, or edema. Respiratory: Normal respiratory effort, no increased work of breathing. GI: Abdomen is soft, non tender, non distended, no abdominal masses. Liver and spleen not palpable.  No hernias appreciated.  Stool sample for occult testing is not indicated.   GU: No CVA tenderness.  No bladder fullness or masses.  Atrophic external genitalia, normal pubic hair distribution, no lesions.  Normal urethral meatus, no lesions, no prolapse, no discharge.   No urethral masses, tenderness and/or tenderness. No bladder fullness, tenderness or masses. Pale vagina mucosa, poor estrogen effect, no discharge, no lesions, good pelvic support, no cystocele or rectocele noted.  Cervix and uterus are surgically absent.  No adnexal/parametria masses or tenderness noted.  Anus and perineum are without rashes or lesions.    Skin: No rashes, bruises or suspicious lesions. Lymph: No cervical or inguinal adenopathy. Neurologic: Grossly intact, no focal deficits, moving all 4 extremities. Psychiatric: Normal mood and affect.  Laboratory Data: Lab Results  Component Value Date   WBC 8.7 05/05/2013   HGB 8.7 (L) 05/05/2013   HCT 27.1 (L) 05/05/2013   MCV 79 (L) 05/05/2013   PLT 370 05/05/2013    Lab Results  Component Value Date   CREATININE 1.54 (H) 04/08/2016    Lab Results   Component Value Date   AST 26 05/05/2013   Lab Results  Component Value Date   ALT 20 05/05/2013    Urinalysis Significant for > 30 WBC's and > 30 RBC's.  See EPIC.  Pertinent Imaging: CLINICAL DATA:  Large obstructing colonic polyps.   EXAM:  CT ABDOMEN AND PELVIS WITH CONTRAST   TECHNIQUE:  Multidetector CT imaging of the abdomen and pelvis was performed  using the standard protocol following bolus administration of  intravenous contrast.   CONTRAST:  100 mL Isovue 300   COMPARISON:  Noncontrast CT on 03/16/2009   FINDINGS:  An intraluminal soft tissue density mass is seen within the  ascending colon measuring approximately 4 cm on image 35. There is  no evidence of colonic obstruction. Diverticulosis is seen involving  the descending colon, however there is no evidence of  diverticulitis. No other inflammatory process or abnormal fluid  collections identified.   A benign hemangioma is seen in segment 3 of the left hepatic lobe  measuring 3.3 x 4.8 cm. No other liver masses are identified. The  gallbladder, spleen, pancreas, and adrenal glands are  normal in  appearance. Nonobstructing left renal calculi are noted in the upper  and lower poles, largest in the lower pole renal collecting system  measuring 8 mm. No evidence of ureteral calculi or hydronephrosis.  Small simple right lower pole renal cyst noted, but no renal masses  are identified. Prior hysterectomy noted. Adnexal regions are  unremarkable.   IMPRESSION:  4 cm intraluminal soft tissue density mass in the ascending colon,  suspicious for colonic neoplasm.   No evidence of metastatic disease.   4.8 cm benign hemangioma in the left hepatic lobe and nonobstructive  left nephrolithiasis incidentally noted.    Electronically Signed    By: Earle Gell M.D.    On: 09/22/2013 10:11      Assessment & Plan:    1. Microscopic hematuria  -  I explained to the patient that there are a  number of causes that can be associated with blood in the urine, such as stones, UTI's, damage to the urinary tract and/or cancer.  - At this time, I felt that the patient warranted further urologic evaluation.   The AUA guidelines state that a CT urogram is the preferred imaging study to evaluate hematuria.  - I explained to the patient that a contrast material will be injected into a vein and that in rare instances, an allergic reaction can result and may even life threatening   The patient denies any allergies to contrast, iodine and/or seafood and is not taking metformin.  - Her reproductive status is status post hysterectomy  - Following the imaging study,  I've recommended a cystoscopy. I described how this is performed, typically in an office setting with a flexible cystoscope. We described the risks, benefits, and possible side effects, the most common of which is a minor amount of blood in the urine and/or burning which usually resolves in 24 to 48 hours- patient would like to think about a cystoscopy  - The patient had the opportunity to ask questions which were answered. Based upon this discussion, the patient is willing to proceed. Therefore, I've ordered: a CT Urogram and cystoscopy.  - She will return following all of the above for discussion of the results.     - Urinalysis, Complete  - CULTURE, URINE COMPREHENSIVE  - BUN+Creat  2. History of colonic polyps  - Patient will be undergoing a CT Urogram  3. Weight loss  - history of a large polyps  - see above  4. Anemia  - history of a large polyps  - see above   Return for CT Urogram report and cystoscopy.  These notes generated with voice recognition software. I apologize for typographical errors.  Zara Council, [facility] [address] [place], [place] [address] [phone]

## 2016-04-09 LAB — BUN+CREAT
BUN/Creatinine Ratio: 22 (ref 12–28)
BUN: 34 mg/dL — ABNORMAL HIGH (ref 8–27)
CREATININE: 1.54 mg/dL — AB (ref 0.57–1.00)
GFR calc Af Amer: 35 mL/min/{1.73_m2} — ABNORMAL LOW (ref >59)
GFR, EST NON AFRICAN AMERICAN: 30 mL/min/{1.73_m2} — AB (ref >59)

## 2016-04-11 LAB — CULTURE, URINE COMPREHENSIVE

## 2016-04-13 ENCOUNTER — Encounter: Payer: Self-pay | Admitting: Urology

## 2016-04-15 ENCOUNTER — Ambulatory Visit
Admission: RE | Admit: 2016-04-15 | Discharge: 2016-04-15 | Disposition: A | Discharge status: Home / Self Care | Payer: Commercial Managed Care - HMO | Source: Ambulatory Visit | Attending: Urology | Admitting: Urology

## 2016-04-15 ENCOUNTER — Telehealth: Payer: Self-pay

## 2016-04-15 DIAGNOSIS — K6389 Other specified diseases of intestine: Secondary | ICD-10-CM | POA: Diagnosis not present

## 2016-04-15 DIAGNOSIS — R3129 Other microscopic hematuria: Secondary | ICD-10-CM

## 2016-04-15 DIAGNOSIS — N200 Calculus of kidney: Secondary | ICD-10-CM | POA: Insufficient documentation

## 2016-04-15 DIAGNOSIS — I700 Atherosclerosis of aorta: Secondary | ICD-10-CM | POA: Insufficient documentation

## 2016-04-15 DIAGNOSIS — D1803 Hemangioma of intra-abdominal structures: Secondary | ICD-10-CM | POA: Diagnosis not present

## 2016-04-15 LAB — POCT I-STAT CREATININE: Creatinine, Ser: 1.2 mg/dL — ABNORMAL HIGH (ref 0.44–1.00)

## 2016-04-15 MED ORDER — IOPAMIDOL (ISOVUE-300) INJECTION 61%
85.0000 mL | Freq: Once | INTRAVENOUS | Status: AC | PRN
  Administered 2016-04-15: 85 mL via INTRAVENOUS

## 2016-04-15 NOTE — Telephone Encounter (Signed)
Imaging called wanting to make you aware pt CT abd/pelvis showed left renal stone with no significant hydronephrosis. And a colonic mass.

## 2016-04-21 NOTE — Telephone Encounter (Signed)
-----   Message from Nori Riis, PA-C sent at 04/20/2016  9:05 PM EDT ----- Patient needs an appointment to discuss her CT results.

## 2016-04-21 NOTE — Telephone Encounter (Signed)
No answer

## 2016-04-22 NOTE — Telephone Encounter (Signed)
Spoke with pt in reference to needing an OV for CT results. Pt voiced understanding. Pt was transferred to the front to make f/u appt.

## 2016-04-28 ENCOUNTER — Ambulatory Visit (INDEPENDENT_AMBULATORY_CARE_PROVIDER_SITE_OTHER): Payer: Commercial Managed Care - HMO | Admitting: Urology

## 2016-04-28 VITALS — BP 144/81 | HR 91 | Ht 62.0 in | Wt 127.2 lb

## 2016-04-28 DIAGNOSIS — K639 Disease of intestine, unspecified: Secondary | ICD-10-CM | POA: Diagnosis not present

## 2016-04-28 DIAGNOSIS — R3129 Other microscopic hematuria: Secondary | ICD-10-CM

## 2016-04-28 DIAGNOSIS — K6389 Other specified diseases of intestine: Secondary | ICD-10-CM

## 2016-04-28 LAB — MICROSCOPIC EXAMINATION: WBC, UA: >30 /hpf — AB (ref 0–?)

## 2016-04-28 LAB — URINALYSIS, COMPLETE
BILIRUBIN UA: NEGATIVE
GLUCOSE, UA: NEGATIVE
KETONES UA: NEGATIVE
NITRITE UA: NEGATIVE
RBC UA: NEGATIVE
SPEC GRAV UA: 1.025 (ref 1.005–1.030)
Urobilinogen, Ur: 0.2 mg/dL (ref 0.2–1.0)
pH, UA: 5 (ref 5.0–7.5)

## 2016-04-28 MED ORDER — CIPROFLOXACIN HCL 500 MG PO TABS
500.0000 mg | ORAL_TABLET | Freq: Once | ORAL | Status: AC
  Administered 2016-04-28: 500 mg via ORAL

## 2016-04-28 MED ORDER — LIDOCAINE HCL 2 % EX GEL
1.0000 "application " | Freq: Once | CUTANEOUS | Status: AC
  Administered 2016-04-28: 1 via URETHRAL

## 2016-04-28 NOTE — Progress Notes (Addendum)
04/28/2016 10:27 AM   [name] 11-27-1926 [id number]  Referring provider: Glendon Axe, MD [facility] [place], [place] [address]  Chief Complaint  Patient presents with  . Cysto    microscopic hematuria    HPI: [name] is a 82yo with microhematuria here for cystoscopy. CT urogram revealed a 1.5cm left renal pelvis stone. It also showed a 4cm ascending colon mass concerning for malignancy. Per patient she is having daily bowel movements. No blood in stool   PMH: Past Medical History:  Diagnosis Date  . Hypertension     Surgical History: Past Surgical History:  Procedure Laterality Date  . ABDOMINAL HYSTERECTOMY    . TOTAL KNEE ARTHROPLASTY Right     Home Medications:    Medication List       Accurate as of 04/28/16 10:27 AM. Always use your most recent med list.          amLODipine 5 MG tablet Commonly known as:  NORVASC Take 5 mg by mouth daily.   IRON PO Take 65 mg by mouth every morning.   pravastatin 40 MG tablet Commonly known as:  PRAVACHOL Take 40 mg by mouth daily.   sertraline 50 MG tablet Commonly known as:  ZOLOFT Take 50 mg by mouth daily.   traMADol 50 MG tablet Commonly known as:  ULTRAM Take 1 tablet (50 mg total) by mouth every 12 (twelve) hours as needed for moderate pain.   vitamin B-12 500 MCG tablet Commonly known as:  CYANOCOBALAMIN Take 500 mcg by mouth daily.       Allergies:  Allergies  Allergen Reactions  . Aspirin Nausea And Vomiting  . Motrin [Ibuprofen] Nausea And Vomiting    Family History: Family History  Problem Relation Age of Onset  . Kidney disease Father   . Heart failure Mother   . Kidney disease Sister   . Bladder Cancer Neg Hx   . Kidney cancer Neg Hx     Social History:  reports that she has never smoked. She has never used smokeless tobacco. She reports that she does not drink alcohol. Her drug history is not on file.  ROS:                                         Physical Exam: BP (!) 144/81   Pulse 91   Ht 5\' 2"  (1.575 m)   Wt 57.7 kg (127 lb 3.2 oz)   BMI 23.27 kg/m   Constitutional:  Alert and oriented, No acute distress. HEENT: [place] AT, moist mucus membranes.  Trachea midline, no masses. Cardiovascular: No clubbing, cyanosis, or edema. Respiratory: Normal respiratory effort, no increased work of breathing. GI: Abdomen is soft, nontender, nondistended, no abdominal masses GU: No CVA tenderness.  Skin: No rashes, bruises or suspicious lesions. Lymph: No cervical or inguinal adenopathy. Neurologic: Grossly intact, no focal deficits, moving all 4 extremities. Psychiatric: Normal mood and affect.  Laboratory Data: Lab Results  Component Value Date   WBC 8.7 05/05/2013   HGB 8.7 (L) 05/05/2013   HCT 27.1 (L) 05/05/2013   MCV 79 (L) 05/05/2013   PLT 370 05/05/2013    Lab Results  Component Value Date   CREATININE 1.20 (H) 04/15/2016    No results found for: PSA  No results found for: TESTOSTERONE  No results found for: HGBA1C  Urinalysis  Component Value Date/Time   COLORURINE Yellow 04/13/2013 0950   APPEARANCEUR Cloudy (A) 04/08/2016 1447   LABSPEC 1.019 04/13/2013 0950   PHURINE 5.0 04/13/2013 0950   GLUCOSEU Negative 04/08/2016 1447   GLUCOSEU Negative 04/13/2013 0950   HGBUR Negative 04/13/2013 0950   BILIRUBINUR Negative 04/08/2016 1447   BILIRUBINUR Negative 04/13/2013 0950   KETONESUR Negative 04/13/2013 0950   PROTEINUR 1+ (A) 04/08/2016 1447   PROTEINUR Negative 04/13/2013 0950   NITRITE Negative 04/08/2016 1447   NITRITE Negative 04/13/2013 0950   LEUKOCYTESUR 3+ (A) 04/08/2016 1447   LEUKOCYTESUR Negative 04/13/2013 0950    Pertinent Imaging: Ct urogram    04/28/16  CC:  Chief Complaint  Patient presents with  . Cysto    microscopic hematuria    HPI:  Blood pressure (!) 144/81, pulse 91, height 5\' 2"  (1.575 m), weight 57.7 kg (127 lb 3.2 oz). NED.  A&Ox3.   No respiratory distress   Abd soft, NT, ND Normal external genitalia with patent urethral meatus  Cystoscopy Procedure Note  Patient identification was confirmed, informed consent was obtained, and patient was prepped using Betadine solution.  Lidocaine jelly was administered per urethral meatus.    Preoperative abx where received prior to procedure.    Procedure: - Flexible cystoscope introduced, without any difficulty.   - Thorough search of the bladder revealed:    normal urethral meatus    normal urothelium    no stones    no ulcers     no tumors    no urethral polyps    no trabeculation  - Ureteral orifices were normal in position and appearance.  Post-Procedure: - Patient tolerated the procedure well  Assessment/ Plan:    Renee Bang, MD   Assessment & Plan:   1. Microscopic hematuria - Urinalysis, Complete - ciprofloxacin (CIPRO) tablet 500 mg; Take 1 tablet (500 mg total) by mouth once. - lidocaine (XYLOCAINE) 2 % jelly 1 application; Place 1 application into the urethra once.  2. Colon mass -referral to Gen Surg  3. Nephrolithiasis -observation, RTC 3 months with KUB   No Follow-up on file.  Renee Bang, MD  [facility] [address], [place], [place] [address] [phone]

## 2016-04-29 ENCOUNTER — Encounter: Payer: Self-pay | Admitting: *Deleted

## 2016-05-14 ENCOUNTER — Encounter: Payer: Self-pay | Admitting: *Deleted

## 2016-05-14 ENCOUNTER — Ambulatory Visit: Payer: Self-pay | Admitting: General Surgery

## 2016-07-30 ENCOUNTER — Ambulatory Visit: Payer: Commercial Managed Care - HMO

## 2016-08-20 ENCOUNTER — Encounter: Payer: Self-pay | Admitting: Urology

## 2016-08-20 ENCOUNTER — Ambulatory Visit: Payer: Commercial Managed Care - HMO | Admitting: Urology

## 2016-10-13 ENCOUNTER — Ambulatory Visit
Admission: RE | Admit: 2016-10-13 | Discharge: 2016-10-13 | Disposition: A | Discharge status: Home / Self Care | Payer: Medicare HMO | Source: Ambulatory Visit | Attending: Urology | Admitting: Urology

## 2016-10-13 ENCOUNTER — Encounter: Payer: Self-pay | Admitting: Urology

## 2016-10-13 ENCOUNTER — Ambulatory Visit (INDEPENDENT_AMBULATORY_CARE_PROVIDER_SITE_OTHER): Payer: Medicare HMO | Admitting: Urology

## 2016-10-13 VITALS — BP 151/72 | HR 87 | Ht 62.0 in | Wt 130.8 lb

## 2016-10-13 DIAGNOSIS — K639 Disease of intestine, unspecified: Secondary | ICD-10-CM | POA: Diagnosis not present

## 2016-10-13 DIAGNOSIS — K6389 Other specified diseases of intestine: Secondary | ICD-10-CM

## 2016-10-13 DIAGNOSIS — N200 Calculus of kidney: Secondary | ICD-10-CM

## 2016-10-13 DIAGNOSIS — R3129 Other microscopic hematuria: Secondary | ICD-10-CM

## 2016-10-13 LAB — MICROSCOPIC EXAMINATION: RBC, UA: >30 /hpf — ABNORMAL HIGH (ref 0–?)

## 2016-10-13 LAB — URINALYSIS, COMPLETE
BILIRUBIN UA: NEGATIVE
Glucose, UA: NEGATIVE
KETONES UA: NEGATIVE
Nitrite, UA: NEGATIVE
Urobilinogen, Ur: 0.2 mg/dL (ref 0.2–1.0)
pH, UA: 5 (ref 5.0–7.5)

## 2016-10-13 NOTE — Progress Notes (Signed)
10/13/2016 11:04 AM   [name] [birth year] [id number]  Referring provider: Glendon Axe, MD [facility] [place], [place] [address]  Chief Complaint  Patient presents with  . Hematuria    KUB results    HPI: The patient is an 83 year old female presents today for follow-up of 1.5 cm left renal pelvis stone found during workup for microscopic hematuria in October 2017.  He underwent repeat KUB today which shows the stone to be stable in size in the left renal pelvis. She has no symptoms at this time. She denies left flank pain. She denies gross hematuria. She denies UTIs.  The patient also was noted to have an ascending colon mass at her last appointment. Referral was made to general surgery at that time, however she has not seen them yet. She does not remember when her last colonoscopy occurred.   PMH: Past Medical History:  Diagnosis Date  . Hypertension     Surgical History: Past Surgical History:  Procedure Laterality Date  . ABDOMINAL HYSTERECTOMY    . TOTAL KNEE ARTHROPLASTY Right     Home Medications:  Allergies as of 10/13/2016      Reactions   Aspirin Nausea And Vomiting   Motrin [ibuprofen] Nausea And Vomiting      Medication List       Accurate as of 10/13/16 11:04 AM. Always use your most recent med list.          amLODipine 5 MG tablet Commonly known as:  NORVASC Take 5 mg by mouth daily.   IRON PO Take 65 mg by mouth every morning.   pravastatin 40 MG tablet Commonly known as:  PRAVACHOL Take 40 mg by mouth daily.   sertraline 50 MG tablet Commonly known as:  ZOLOFT Take 50 mg by mouth daily.   traMADol 50 MG tablet Commonly known as:  ULTRAM Take 1 tablet (50 mg total) by mouth every 12 (twelve) hours as needed for moderate pain.   vitamin B-12 500 MCG tablet Commonly known as:  CYANOCOBALAMIN Take 500 mcg by mouth daily.       Allergies:  Allergies  Allergen Reactions  . Aspirin Nausea And  Vomiting  . Motrin [Ibuprofen] Nausea And Vomiting    Family History: Family History  Problem Relation Age of Onset  . Kidney disease Father   . Heart failure Mother   . Kidney disease Sister   . Bladder Cancer Neg Hx   . Kidney cancer Neg Hx     Social History:  reports that she has never smoked. She has never used smokeless tobacco. She reports that she does not drink alcohol. Her drug history is not on file.  ROS: UROLOGY Frequent Urination?: No Hard to postpone urination?: No Burning/pain with urination?: No Get up at night to urinate?: No Leakage of urine?: No Urine stream starts and stops?: No Trouble starting stream?: No Do you have to strain to urinate?: No Blood in urine?: No Urinary tract infection?: No Sexually transmitted disease?: No Injury to kidneys or bladder?: No Painful intercourse?: No Weak stream?: No Currently pregnant?: No Vaginal bleeding?: No Last menstrual period?: n  Gastrointestinal Nausea?: No Vomiting?: No Indigestion/heartburn?: No Diarrhea?: No Constipation?: No  Constitutional Fever: No Night sweats?: No Weight loss?: Yes Fatigue?: No  Skin Skin rash/lesions?: No Itching?: No  Eyes Blurred vision?: Yes Double vision?: No  Ears/Nose/Throat Sore throat?: No Sinus problems?: No  Hematologic/Lymphatic Swollen glands?: No Easy bruising?: No  Cardiovascular Leg  swelling?: No Chest pain?: No  Respiratory Cough?: No Shortness of breath?: Yes  Endocrine Excessive thirst?: No  Musculoskeletal Back pain?: No Joint pain?: No  Neurological Headaches?: No Dizziness?: No  Psychologic Depression?: No Anxiety?: No  Physical Exam: BP (!) 151/72 (BP Location: Left Arm, Patient Position: Sitting, Cuff Size: Normal)   Pulse 87   Ht 5\' 2"  (1.575 m)   Wt 130 lb 12.8 oz (59.3 kg)   BMI 23.92 kg/m   Constitutional:  Alert and oriented, No acute distress. HEENT: [place] AT, moist mucus membranes.  Trachea midline, no  masses. Cardiovascular: No clubbing, cyanosis, or edema. Respiratory: Normal respiratory effort, no increased work of breathing. GI: Abdomen is soft, nontender, nondistended, no abdominal masses GU: No CVA tenderness.  Skin: No rashes, bruises or suspicious lesions. Lymph: No cervical or inguinal adenopathy. Neurologic: Grossly intact, no focal deficits, moving all 4 extremities. Psychiatric: Normal mood and affect.  Laboratory Data: Lab Results  Component Value Date   WBC 8.7 05/05/2013   HGB 8.7 (L) 05/05/2013   HCT 27.1 (L) 05/05/2013   MCV 79 (L) 05/05/2013   PLT 370 05/05/2013    Lab Results  Component Value Date   CREATININE 1.20 (H) 04/15/2016    No results found for: PSA  No results found for: TESTOSTERONE  No results found for: HGBA1C  Urinalysis    Component Value Date/Time   COLORURINE Yellow 04/13/2013 0950   APPEARANCEUR Cloudy (A) 04/28/2016 1008   LABSPEC 1.019 04/13/2013 0950   PHURINE 5.0 04/13/2013 0950   GLUCOSEU Negative 04/28/2016 1008   GLUCOSEU Negative 04/13/2013 0950   HGBUR Negative 04/13/2013 0950   BILIRUBINUR Negative 04/28/2016 1008   BILIRUBINUR Negative 04/13/2013 0950   KETONESUR Negative 04/13/2013 0950   PROTEINUR Trace (A) 04/28/2016 1008   PROTEINUR Negative 04/13/2013 0950   NITRITE Negative 04/28/2016 1008   NITRITE Negative 04/13/2013 0950   LEUKOCYTESUR 2+ (A) 04/28/2016 1008   LEUKOCYTESUR Negative 04/13/2013 0950    Pertinent Imaging: KUB reviewed as above  Assessment & Plan:    1. Left renal stone We'll continue active surveillance at this point. We'll repeat KUB in 6 months. If it is stable at that time, we can consider increasing interval of surveillance to one year.  2. Ascending colon mass Will refer patient to general surgery at this time for further evaluation.  Return in about 6 months (around 04/14/2017) for KUB prior.  Nickie Retort, MD  [facility] [address]  [place], [place] [address] [phone]

## 2016-10-14 ENCOUNTER — Telehealth: Payer: Self-pay | Admitting: General Surgery

## 2016-10-14 NOTE — Telephone Encounter (Signed)
PATIENT WAS ALSO INSTRUCTED  AFTER SPEAKING WITH DR Aaron Edelman BUDZYN'S OFFICE TO CALL BACK AND LET ME KNOW IF SHE WANTED TO MAKE AN APPOINTMENT OR NOT/MTH

## 2016-10-14 NOTE — Telephone Encounter (Signed)
I CALLED PATIENT TO MAKE HER AN APPOINTMENT FROM THE REFERRAL WORK Q. SHE IS BEING REFERRED BY DR Baruch Gouty FOR A COLON MASS. PATIENT WAS UNAWARE.I GAVE HER HIS NUMBER TO HAVE HER CALL THE OFFICE SO THEY COULD EXPLAIN WHY SHE NEEDED TO SEE A SURGEON. I ALSO CALLED HIS OFFICE AND RELAID THE ABOVE.

## 2016-11-03 DIAGNOSIS — K6389 Other specified diseases of intestine: Secondary | ICD-10-CM | POA: Insufficient documentation

## 2016-11-03 NOTE — Progress Notes (Deleted)
[place]  [phone] Fax:([phone]) [phone]  ID: [name] OB: 31-Dec-1926  MR#: [id number]  HYW#:[id number]  Patient Care Team: Glendon Axe, MD as PCP - General (Internal Medicine) Cleon Gustin, MD as Consulting Physician (Urology) Seeplaputhur Robinette Haines, MD (General Surgery)  CHIEF COMPLAINT: Ascending colon mass.  INTERVAL HISTORY: ***  REVIEW OF SYSTEMS:   ROS  As per HPI. Otherwise, a complete review of systems is negative.  PAST MEDICAL HISTORY: Past Medical History:  Diagnosis Date  . Hypertension     PAST SURGICAL HISTORY: Past Surgical History:  Procedure Laterality Date  . ABDOMINAL HYSTERECTOMY    . TOTAL KNEE ARTHROPLASTY Right     FAMILY HISTORY: Family History  Problem Relation Age of Onset  . Kidney disease Father   . Heart failure Mother   . Kidney disease Sister   . Bladder Cancer Neg Hx   . Kidney cancer Neg Hx     ADVANCED DIRECTIVES (Y/N):  N  HEALTH MAINTENANCE: Social History  Substance Use Topics  . Smoking status: Never Smoker  . Smokeless tobacco: Never Used  . Alcohol use No     Colonoscopy:  PAP:  Bone density:  Lipid panel:  Allergies  Allergen Reactions  . Aspirin Nausea And Vomiting  . Motrin [Ibuprofen] Nausea And Vomiting    Current Outpatient Prescriptions  Medication Sig Dispense Refill  . amLODipine (NORVASC) 5 MG tablet Take 5 mg by mouth daily.    . IRON PO Take 65 mg by mouth every morning.    . pravastatin (PRAVACHOL) 40 MG tablet Take 40 mg by mouth daily.    . sertraline (ZOLOFT) 50 MG tablet Take 50 mg by mouth daily.    . traMADol (ULTRAM) 50 MG tablet Take 1 tablet (50 mg total) by mouth every 12 (twelve) hours as needed for moderate pain. 12 tablet 0  . vitamin B-12 (CYANOCOBALAMIN) 500 MCG tablet Take 500 mcg by mouth daily.     No current facility-administered medications for this visit.     OBJECTIVE: There were no vitals filed for this visit.   There  is no height or weight on file to calculate BMI.    ECOG FS:{CHL ONC [id number]  General: Well-developed, well-nourished, no acute distress. Eyes: Pink conjunctiva, anicteric sclera. HEENT: Normocephalic, moist mucous membranes, clear oropharnyx. Lungs: Clear to auscultation bilaterally. Heart: Regular rate and rhythm. No rubs, murmurs, or gallops. Abdomen: Soft, nontender, nondistended. No organomegaly noted, normoactive bowel sounds. Musculoskeletal: No edema, cyanosis, or clubbing. Neuro: Alert, answering all questions appropriately. Cranial nerves grossly intact. Skin: No rashes or petechiae noted. Psych: Normal affect. Lymphatics: No cervical, calvicular, axillary or inguinal LAD.   LAB RESULTS:  Lab Results  Component Value Date   NA 135 (L) 05/05/2013   K 4.6 05/05/2013   CL 102 05/05/2013   CO2 26 05/05/2013   GLUCOSE 105 (H) 05/05/2013   BUN 34 (H) 04/08/2016   CREATININE 1.20 (H) 04/15/2016   CALCIUM 9.7 05/05/2013   PROT 7.6 05/05/2013   ALBUMIN 3.0 (L) 05/05/2013   AST 26 05/05/2013   ALT 20 05/05/2013   ALKPHOS 122 05/05/2013   BILITOT 0.3 05/05/2013   GFRNONAA 30 (L) 04/08/2016   GFRAA 35 (L) 04/08/2016    Lab Results  Component Value Date   WBC 8.7 05/05/2013   HGB 8.7 (L) 05/05/2013   HCT 27.1 (L) 05/05/2013   MCV 79 (L) 05/05/2013   PLT 370 05/05/2013     STUDIES: Dg  Abd 1 View  Result Date: 10/13/2016 CLINICAL DATA:  History kidney stones EXAM: ABDOMEN - 1 VIEW COMPARISON:  CT abdomen pelvis of 04/15/2016 FINDINGS: Compared to prior CT, the largest calculus was noted within the left renal pelvis and it appears to remain measuring 17 mm in diameter. The smaller calculus noted in the lower pole of the left collecting system at that time is no longer seen. The tiny right renal calculi noted by prior CT are not visualized by plain film. The bowel gas pattern is nonspecific. Mild curvature of the lumbar spine convex to the left is present. Multiple  calcified phleboliths are noted within the bony pelvis. IMPRESSION: 1. The largest calculus noted previously within the left renal pelvis is unchanged measuring 17 mm in diameter. 2. A smaller calculus noted by prior CT on the left is no longer seen. 3. Tiny right renal calculi are not visualized by plain film. Electronically Signed   By: Ivar Drape M.D.   On: 10/13/2016 10:27    ASSESSMENT: Ascending colon mass  PLAN:    1. Ascending colon mass:  Patient expressed understanding and was in agreement with this plan. She also understands that She can call clinic at any time with any questions, concerns, or complaints.   Cancer Staging No matching staging information was found for the patient.  Lloyd Huger, MD   11/03/2016 11:31 PM

## 2016-11-04 ENCOUNTER — Inpatient Hospital Stay: Payer: Medicare HMO | Admitting: Oncology

## 2016-11-15 NOTE — Progress Notes (Signed)
[place]  [phone] [phone]) [phone]  ID: [name] OB: May 22, 1927  MR#: [id number]  BWG#:[id number]  [name] Care Team: Glendon Axe, MD as PCP - General (Internal Medicine) McKenzie, Candee Furbish, MD as Consulting Physician (Urology) Christene Lye, MD (General Surgery)  CHIEF COMPLAINT: Ascending colon mass.  INTERVAL HISTORY: [name] is an 83 year old female who was noted to have a suspicious circumferential mass in her ascending colon on CT scan. Upon review of [name]'s chart, a similar mass was noted on CT scan 3 years ago with minimal change. By report, she had heme positive stools. Currently, [name] feels well and is asymptomatic. She has no neurologic complaints. She denies any recent fevers. She has a good appetite and denies weight loss. She has no chest pain or shortness of breath. She denies any nausea, vomiting, constipation, or diarrhea. She has no urinary complaints. [name] feels at her baseline and offers no specific complaints today.  REVIEW OF SYSTEMS:   Review of Systems  Constitutional: Negative.  Negative for fever, malaise/fatigue and weight loss.  Respiratory: Negative.  Negative for cough and shortness of breath.   Cardiovascular: Negative.  Negative for chest pain and leg swelling.  Gastrointestinal: Positive for blood in stool. Negative for abdominal pain and melena.  Genitourinary: Negative.   Musculoskeletal: Negative.   Skin: Negative.   Neurological: Negative.  Negative for weakness.  Psychiatric/Behavioral: Negative.  The [name] is not nervous/anxious.     As per HPI. Otherwise, a complete review of systems is negative.  PAST MEDICAL HISTORY: Past Medical History:  Diagnosis Date  . Asthma   . Depression   . Hypertension     PAST SURGICAL HISTORY: Past Surgical History:  Procedure Laterality Date  . ABDOMINAL HYSTERECTOMY    . TOTAL KNEE ARTHROPLASTY Right     FAMILY HISTORY: Family  History  Problem Relation Age of Onset  . Kidney disease Father   . Heart failure Mother   . Kidney disease Sister   . Bladder Cancer Neg Hx   . Kidney cancer Neg Hx     ADVANCED DIRECTIVES (Y/N):  N  HEALTH MAINTENANCE: Social History  Substance Use Topics  . Smoking status: Never Smoker  . Smokeless tobacco: Never Used  . Alcohol use No     Colonoscopy:  PAP:  Bone density:  Lipid panel:  Allergies  Allergen Reactions  . Aspirin Nausea And Vomiting  . Motrin [Ibuprofen] Nausea And Vomiting    Current Outpatient Prescriptions  Medication Sig Dispense Refill  . albuterol (PROVENTIL HFA;VENTOLIN HFA) 108 (90 Base) MCG/ACT inhaler Inhale into the lungs.    Marland Kitchen amLODipine (NORVASC) 5 MG tablet Take 5 mg by mouth daily.    . IRON PO Take 65 mg by mouth every morning.    . mometasone (ASMANEX) 220 MCG/INH inhaler Inhale into the lungs.    . pantoprazole (PROTONIX) 40 MG tablet Take by mouth.    . pravastatin (PRAVACHOL) 40 MG tablet Take 40 mg by mouth daily.    . sertraline (ZOLOFT) 50 MG tablet Take 50 mg by mouth daily.    . vitamin B-12 (CYANOCOBALAMIN) 500 MCG tablet Take 500 mcg by mouth daily.    . traMADol (ULTRAM) 50 MG tablet Take 1 tablet (50 mg total) by mouth every 12 (twelve) hours as needed for moderate pain. 12 tablet 0   No current facility-administered medications for this visit.     OBJECTIVE: Vitals:   11/16/16 1504  BP: (!) 154/86  Pulse:  78  Resp: 20  Temp: 97.8 F (36.6 C)     Body mass index is 23.67 kg/m.    ECOG FS:0 - Asymptomatic  General: Well-developed, well-nourished, no acute distress. Eyes: Pink conjunctiva, anicteric sclera. HEENT: Normocephalic, moist mucous membranes, clear oropharnyx. Lungs: Clear to auscultation bilaterally. Heart: Regular rate and rhythm. No rubs, murmurs, or gallops. Abdomen: Soft, nontender, nondistended. No organomegaly noted, normoactive bowel sounds. Musculoskeletal: No edema, cyanosis, or  clubbing. Neuro: Alert, answering all questions appropriately. Cranial nerves grossly intact. Skin: No rashes or petechiae noted. Psych: Normal affect. Lymphatics: No cervical, calvicular, axillary or inguinal LAD.   LAB RESULTS:  Lab Results  Component Value Date   NA 135 (L) 05/05/2013   K 4.6 05/05/2013   CL 102 05/05/2013   CO2 26 05/05/2013   GLUCOSE 105 (H) 05/05/2013   BUN 34 (H) 04/08/2016   CREATININE 1.20 (H) 04/15/2016   CALCIUM 9.7 05/05/2013   PROT 7.6 05/05/2013   ALBUMIN 3.0 (L) 05/05/2013   AST 26 05/05/2013   ALT 20 05/05/2013   ALKPHOS 122 05/05/2013   BILITOT 0.3 05/05/2013   GFRNONAA 30 (L) 04/08/2016   GFRAA 35 (L) 04/08/2016    Lab Results  Component Value Date   WBC 6.3 11/16/2016   NEUTROABS 4.1 11/16/2016   HGB 8.8 (L) 11/16/2016   HCT 28.4 (L) 11/16/2016   MCV 68.5 (L) 11/16/2016   PLT 268 11/16/2016   Lab Results  Component Value Date   IRON 86 11/16/2016   TIBC 387 11/16/2016   IRONPCTSAT 22 11/16/2016   Lab Results  Component Value Date   FERRITIN 11 11/16/2016     STUDIES: No results found.  ASSESSMENT: Ascending colon mass  PLAN:    1. Ascending colon mass:Concerning for malignancy, but given the stability over time this is unclear. [name] is also asymptomatic. She has had a referral to surgery, but has no showed on several occasions. Will repeat CT scan in the next 1-2 weeks assess for any interval change. Tumor markers have also been ordered and are pending at time of dictation. Given [name]'s advanced age, if malignancy was discovered her treatment options would be limited and chemotherapy would not be recommended. Will consider encouraging [name] to keep her surgery appointment if suspicion has increased. No follow-up has been scheduled at this time, but will arrange follow-up of her CT scan a laboratory work is suspicious.  Approximately 45 minutes was spent in discussion of which greater than 50% was consultation.    [name] expressed understanding and was in agreement with this plan. She also understands that She can call clinic at any time with any questions, concerns, or complaints.   Cancer Staging No matching staging information was found for the [name].  Lloyd Huger, MD   11/16/2016 5:40 PM

## 2016-11-16 ENCOUNTER — Other Ambulatory Visit: Payer: Self-pay

## 2016-11-16 ENCOUNTER — Inpatient Hospital Stay: Payer: Medicare HMO

## 2016-11-16 ENCOUNTER — Encounter: Payer: Self-pay | Admitting: Oncology

## 2016-11-16 ENCOUNTER — Inpatient Hospital Stay: Payer: Medicare HMO | Attending: Oncology | Admitting: Oncology

## 2016-11-16 VITALS — BP 154/86 | HR 78 | Temp 97.8°F | Resp 20 | Ht 62.0 in | Wt 129.4 lb

## 2016-11-16 DIAGNOSIS — J45909 Unspecified asthma, uncomplicated: Secondary | ICD-10-CM | POA: Insufficient documentation

## 2016-11-16 DIAGNOSIS — K639 Disease of intestine, unspecified: Secondary | ICD-10-CM | POA: Diagnosis not present

## 2016-11-16 DIAGNOSIS — I10 Essential (primary) hypertension: Secondary | ICD-10-CM | POA: Diagnosis not present

## 2016-11-16 DIAGNOSIS — F329 Major depressive disorder, single episode, unspecified: Secondary | ICD-10-CM | POA: Insufficient documentation

## 2016-11-16 DIAGNOSIS — K6389 Other specified diseases of intestine: Secondary | ICD-10-CM

## 2016-11-16 DIAGNOSIS — Z96651 Presence of right artificial knee joint: Secondary | ICD-10-CM | POA: Diagnosis not present

## 2016-11-16 LAB — CBC WITH DIFFERENTIAL/PLATELET
BASOS ABS: 0 10*3/uL (ref 0–0.1)
BASOS PCT: 1 %
Eosinophils Absolute: 0.2 10*3/uL (ref 0–0.7)
Eosinophils Relative: 3 %
HCT: 28.4 % — ABNORMAL LOW (ref 35.0–47.0)
HEMOGLOBIN: 8.8 g/dL — AB (ref 12.0–16.0)
LYMPHS PCT: 23 %
Lymphs Abs: 1.4 10*3/uL (ref 1.0–3.6)
MCH: 21.3 pg — AB (ref 26.0–34.0)
MCHC: 31.1 g/dL — ABNORMAL LOW (ref 32.0–36.0)
MCV: 68.5 fL — AB (ref 80.0–100.0)
Monocytes Absolute: 0.5 10*3/uL (ref 0.2–0.9)
Monocytes Relative: 8 %
NEUTROS ABS: 4.1 10*3/uL (ref 1.4–6.5)
NEUTROS PCT: 65 %
PLATELETS: 268 10*3/uL (ref 150–440)
RBC: 4.15 MIL/uL (ref 3.80–5.20)
RDW: 17.6 % — ABNORMAL HIGH (ref 11.5–14.5)
WBC: 6.3 10*3/uL (ref 3.6–11.0)

## 2016-11-16 LAB — IRON AND TIBC
Iron: 86 ug/dL (ref 28–170)
SATURATION RATIOS: 22 % (ref 10.4–31.8)
TIBC: 387 ug/dL (ref 250–450)
UIBC: 301 ug/dL

## 2016-11-16 LAB — FERRITIN: Ferritin: 11 ng/mL (ref 11–307)

## 2016-11-17 LAB — CEA: CEA: 9 ng/mL — AB (ref 0.0–4.7)

## 2016-11-17 LAB — CA 125: CA 125: 7.3 U/mL (ref 0.0–38.1)

## 2016-11-20 ENCOUNTER — Ambulatory Visit: Admission: RE | Admit: 2016-11-20 | Payer: Medicare HMO | Source: Ambulatory Visit

## 2016-11-20 ENCOUNTER — Ambulatory Visit
Admission: RE | Admit: 2016-11-20 | Discharge: 2016-11-20 | Disposition: A | Discharge status: Home / Self Care | Payer: Medicare HMO | Source: Ambulatory Visit | Attending: Oncology | Admitting: Oncology

## 2016-11-20 DIAGNOSIS — R160 Hepatomegaly, not elsewhere classified: Secondary | ICD-10-CM | POA: Diagnosis not present

## 2016-11-20 DIAGNOSIS — N200 Calculus of kidney: Secondary | ICD-10-CM | POA: Insufficient documentation

## 2016-11-20 DIAGNOSIS — D1803 Hemangioma of intra-abdominal structures: Secondary | ICD-10-CM | POA: Insufficient documentation

## 2016-11-20 DIAGNOSIS — K6389 Other specified diseases of intestine: Secondary | ICD-10-CM

## 2016-11-20 DIAGNOSIS — K5730 Diverticulosis of large intestine without perforation or abscess without bleeding: Secondary | ICD-10-CM | POA: Diagnosis not present

## 2016-11-20 DIAGNOSIS — K639 Disease of intestine, unspecified: Secondary | ICD-10-CM | POA: Insufficient documentation

## 2016-11-20 LAB — POCT I-STAT CREATININE: Creatinine, Ser: 1.3 mg/dL — ABNORMAL HIGH (ref 0.44–1.00)

## 2016-11-20 MED ORDER — IOPAMIDOL (ISOVUE-300) INJECTION 61%
75.0000 mL | Freq: Once | INTRAVENOUS | Status: AC | PRN
  Administered 2016-11-20: 75 mL via INTRAVENOUS

## 2016-11-26 ENCOUNTER — Encounter: Payer: Self-pay | Admitting: *Deleted

## 2016-11-30 ENCOUNTER — Other Ambulatory Visit: Payer: Self-pay

## 2016-11-30 DIAGNOSIS — K6389 Other specified diseases of intestine: Secondary | ICD-10-CM

## 2016-12-02 ENCOUNTER — Ambulatory Visit: Payer: Medicare HMO | Admitting: Gastroenterology

## 2016-12-14 ENCOUNTER — Encounter: Payer: Self-pay | Admitting: *Deleted

## 2016-12-17 ENCOUNTER — Encounter: Payer: Self-pay | Admitting: General Surgery

## 2016-12-17 ENCOUNTER — Ambulatory Visit (INDEPENDENT_AMBULATORY_CARE_PROVIDER_SITE_OTHER): Payer: Medicare PPO | Admitting: General Surgery

## 2016-12-17 VITALS — BP 120/64 | HR 77 | Resp 14 | Ht 60.0 in | Wt 130.0 lb

## 2016-12-17 DIAGNOSIS — K6389 Other specified diseases of intestine: Secondary | ICD-10-CM

## 2016-12-17 DIAGNOSIS — C182 Malignant neoplasm of ascending colon: Secondary | ICD-10-CM | POA: Diagnosis not present

## 2016-12-17 NOTE — Progress Notes (Addendum)
Patient ID: [name], female   DOB: 04-02-1927, 83 y.o.   MRN: [id number]  Chief Complaint  Patient presents with  . Mass    colon    HPI [name] is a 83 y.o. female.  Here today for evaluation of colon mass referred by Dawson Bills NP. Last colonoscopy was 2013. She noticed blood in her urine 1 years ago. No further bloody urine. CT scan was 04-15-16 and  11-20-16 following up from the bloody urine. She states the stool card did not show any blood in the stool. She does not remember if they told her about the colon mass bu thte sister thinks that she was told last year. Bowels move daily. She admits to weight loss( 19 pounds) with poor appetite for about 1 year. She is a retired Theatre manager /hair Ecologist. She is using a walker today. She did see Dr Grayland Ormond last month. She is here with her sister, [name]. She does have some memory issues and lives alone. During the interview the second of her for remaining siblings was present for the discussion. HPI  Past Medical History:  Diagnosis Date  . Asthma   . Depression   . Hypertension   . Memory deficit     Past Surgical History:  Procedure Laterality Date  . ABDOMINAL HYSTERECTOMY    . COLONOSCOPY  2013  . JOINT REPLACEMENT Right 04/2013   Dr Marry Guan  . UPPER GI ENDOSCOPY  2013    Family History  Problem Relation Age of Onset  . Kidney disease Father   . Heart failure Mother   . Kidney disease Sister   . Bladder Cancer Neg Hx   . Kidney cancer Neg Hx     Social History Social History  Substance Use Topics  . Smoking status: Never Smoker  . Smokeless tobacco: Never Used  . Alcohol use No    Allergies  Allergen Reactions  . Aspirin Nausea And Vomiting  . Motrin [Ibuprofen] Nausea And Vomiting    Current Outpatient Prescriptions  Medication Sig Dispense Refill  . albuterol (PROVENTIL HFA;VENTOLIN HFA) 108 (90 Base) MCG/ACT inhaler Inhale 2 puffs into the lungs every 4 (four) hours as needed.     Marland Kitchen  amLODipine (NORVASC) 5 MG tablet Take 5 mg by mouth daily.    . IRON PO Take 65 mg by mouth every morning.    . mometasone (ASMANEX) 220 MCG/INH inhaler Inhale into the lungs.    . pantoprazole (PROTONIX) 40 MG tablet Take by mouth.    . pravastatin (PRAVACHOL) 40 MG tablet Take 40 mg by mouth daily.    . sertraline (ZOLOFT) 50 MG tablet Take 50 mg by mouth daily.    . traMADol (ULTRAM) 50 MG tablet Take 1 tablet (50 mg total) by mouth every 12 (twelve) hours as needed for moderate pain. 12 tablet 0  . vitamin B-12 (CYANOCOBALAMIN) 500 MCG tablet Take 500 mcg by mouth daily.     No current facility-administered medications for this visit.     Review of Systems Review of Systems  Constitutional: Negative.   Respiratory: Negative.   Cardiovascular: Negative.   Gastrointestinal: Negative for blood in stool, constipation and diarrhea.    Blood pressure 120/64, pulse 77, resp. rate 14, height 5' (1.524 m), weight 130 lb (59 kg).  Physical Exam Physical Exam  Constitutional: She is oriented to person, place, and time. She appears well-developed and well-nourished.  HENT:  Mouth/Throat: Oropharynx is clear and moist.  Eyes: Conjunctivae  are normal. No scleral icterus.  Neck: Neck supple. Carotid bruit is not present.  Cardiovascular: Normal rate, regular rhythm, normal heart sounds and intact distal pulses.   No lower extremity edema  Pulmonary/Chest: Effort normal and breath sounds normal.  Abdominal: Soft. Normal appearance and bowel sounds are normal. There is no tenderness. No hernia.  Lymphadenopathy:    She has no cervical adenopathy.  Neurological: She is alert and oriented to person, place, and time.  Skin: Skin is warm and dry.  Psychiatric: Her behavior is normal.    Data Reviewed Colonoscopy completed 10/29/2011 described a fungating, partially obstructing mass in the midascending colon.   Biopsy of the ascending colon mass described a tubular adenoma. No evidence of  high-grade dysplasia or malignancy.  CT scan of 09/22/2013 reported for a large obstructing colonic polyp reported a 4 cm mass near ascending colon. Benign hemangioma in the left lobe of the liver.  CT scan of 04/15/2016 obtained for evaluation of hematuria showed a circumferential mass in the ascending colon highly suspicious for malignancy.  CT scan dated 11/20/2016 obtained for blood in the stool showed a 3 x 5 cm mass in the ascending colon described as annular and constricting with extension into the pericolonic fat. New 1.8 cm hypovascular mass in the right inferior lobe of the liver. This is suspicious for metastatic disease.  The above imaging studies were independently reviewed.  CBC obtained 11/16/2016 showed a hemoglobin of 8.8 with an MCV of 68.5. MCV 3 years ago was 79.  CEA of the same date: 9.0.   Assessment    Right colon cancer with likely metastatic disease to the liver.    Plan    The patient has exhibited the natural history of colonic neoplasia over the past 5 years. At this time I get no indication from the patient or her sisters that there is any plans for surgical intervention. The likelihood of progression to obstruction was reviewed. Surgical intervention at that time will be fraught with our greater morbidity/mortality than elective intervention.  This patient is a moderate risk for surgical intervention, but certainly not prohibitive.  The patient and her sisters were encouraged to call at they desire to discuss surgical intervention more detail, although I'm not convinced this [calling liver] will ever come.     HPI, Physical Exam, Assessment and Plan have been scribed under the direction and in the presence of Robert Bellow, MD.  Karie Fetch, RN  I have completed the exam and reviewed the above documentation for accuracy and completeness.  I agree with the above.  Haematologist has been used and any errors in dictation or transcription are  unintentional.  Hervey Ard, M.D., F.A.C.S.     Robert Bellow 12/19/2016, 7:12 AM

## 2016-12-17 NOTE — Patient Instructions (Addendum)
The patient is aware to call back for any questions or concerns.  She will decide about surgery and call back.

## 2017-04-14 ENCOUNTER — Observation Stay
Admission: RE | Admit: 2017-04-14 | Discharge: 2017-04-14 | Disposition: A | Discharge status: Home / Self Care | Payer: Medicare PPO | Source: Ambulatory Visit | Attending: Urology | Admitting: Urology

## 2017-04-14 ENCOUNTER — Ambulatory Visit: Payer: Medicare PPO | Admitting: Urology

## 2017-04-14 ENCOUNTER — Ambulatory Visit
Admission: RE | Admit: 2017-04-14 | Discharge: 2017-04-14 | Disposition: A | Discharge status: Home / Self Care | Payer: Medicare PPO | Source: Ambulatory Visit | Attending: Urology | Admitting: Urology

## 2017-04-14 DIAGNOSIS — N200 Calculus of kidney: Secondary | ICD-10-CM | POA: Insufficient documentation

## 2017-04-14 DIAGNOSIS — K639 Disease of intestine, unspecified: Secondary | ICD-10-CM | POA: Insufficient documentation

## 2017-04-15 ENCOUNTER — Ambulatory Visit: Payer: Medicare PPO

## 2017-05-12 ENCOUNTER — Encounter: Payer: Self-pay | Admitting: Urology

## 2017-05-12 ENCOUNTER — Ambulatory Visit (INDEPENDENT_AMBULATORY_CARE_PROVIDER_SITE_OTHER): Payer: Medicare PPO | Admitting: Urology

## 2017-05-12 VITALS — BP 128/68 | HR 97 | Ht 60.0 in | Wt 123.7 lb

## 2017-05-12 DIAGNOSIS — N200 Calculus of kidney: Secondary | ICD-10-CM | POA: Diagnosis not present

## 2017-05-12 NOTE — Progress Notes (Signed)
05/12/2017 9:48 AM   [name] Mar 21, 1927 [id number]  Referring provider: Glendon Axe, MD [facility] [place], [place] [address]  Chief Complaint  Patient presents with  . Follow-up    KUB results    HPI: The patient is an 83 year old female presents today for follow-up of 1.5 cm left renal pelvis stone found during workup for microscopic hematuria in October 2017.  She underwent repeat KUB today which shows the stone to be stable in size in the left renal pelvis in April 2018.  Repeat KUB in October 2018 confirms stone stability.   She has no symptoms at this time. She denies left flank pain. She denies gross hematuria. She denies UTIs.  The patient also was noted to have an ascending colon mass at her last appointment. Referral was made to general surgery at that time, however she has not seen them yet. She does not remember when her last colonoscopy occurred.      PMH: Past Medical History:  Diagnosis Date  . Asthma   . Depression   . Hypertension   . Memory deficit     Surgical History: Past Surgical History:  Procedure Laterality Date  . ABDOMINAL HYSTERECTOMY    . COLONOSCOPY  2013  . JOINT REPLACEMENT Right 04/2013   Dr Marry Guan  . UPPER GI ENDOSCOPY  2013    Home Medications:  Allergies as of 05/12/2017      Reactions   Aspirin Nausea And Vomiting   Motrin [ibuprofen] Nausea And Vomiting      Medication List       Accurate as of 05/12/17  9:48 AM. Always use your most recent med list.          albuterol 108 (90 Base) MCG/ACT inhaler Commonly known as:  PROVENTIL HFA;VENTOLIN HFA Inhale 2 puffs into the lungs every 4 (four) hours as needed.   amLODipine 5 MG tablet Commonly known as:  NORVASC Take 5 mg by mouth daily.   IRON PO Take 65 mg by mouth every morning.   mometasone 220 MCG/INH inhaler Commonly known as:  ASMANEX Inhale into the lungs.   pantoprazole 40 MG tablet Commonly known as:   PROTONIX Take by mouth.   pravastatin 40 MG tablet Commonly known as:  PRAVACHOL Take 40 mg by mouth daily.   sertraline 50 MG tablet Commonly known as:  ZOLOFT Take 50 mg by mouth daily.   traMADol 50 MG tablet Commonly known as:  ULTRAM Take 1 tablet (50 mg total) by mouth every 12 (twelve) hours as needed for moderate pain.   vitamin B-12 500 MCG tablet Commonly known as:  CYANOCOBALAMIN Take 500 mcg by mouth daily.       Allergies:  Allergies  Allergen Reactions  . Aspirin Nausea And Vomiting  . Motrin [Ibuprofen] Nausea And Vomiting    Family History: Family History  Problem Relation Age of Onset  . Kidney disease Father   . Heart failure Mother   . Kidney disease Sister   . Bladder Cancer Neg Hx   . Kidney cancer Neg Hx     Social History:  reports that she has never smoked. She has never used smokeless tobacco. She reports that she does not drink alcohol or use drugs.  ROS: UROLOGY Frequent Urination?: No Hard to postpone urination?: No Burning/pain with urination?: No Get up at night to urinate?: No Leakage of urine?: No Urine stream starts and stops?: No Trouble starting stream?: No Do you  have to strain to urinate?: No Blood in urine?: No Urinary tract infection?: No Sexually transmitted disease?: No Injury to kidneys or bladder?: No Painful intercourse?: No Weak stream?: No Currently pregnant?: No Vaginal bleeding?: No Last menstrual period?: n  Gastrointestinal Nausea?: No Vomiting?: No Indigestion/heartburn?: No Diarrhea?: No Constipation?: No  Constitutional Fever: No Night sweats?: No Weight loss?: Yes Fatigue?: No  Skin Skin rash/lesions?: No Itching?: No  Eyes Blurred vision?: Yes Double vision?: No  Ears/Nose/Throat Sore throat?: No Sinus problems?: No  Hematologic/Lymphatic Swollen glands?: No Easy bruising?: No  Cardiovascular Leg swelling?: No Chest pain?: No  Respiratory Cough?: No Shortness of  breath?: No  Endocrine Excessive thirst?: No  Musculoskeletal Back pain?: No Joint pain?: No  Neurological Headaches?: No Dizziness?: No  Psychologic Depression?: No Anxiety?: No  Physical Exam: BP 128/68 (BP Location: Right Arm, Patient Position: Sitting, Cuff Size: Normal)   Pulse 97   Ht 5' (1.524 m)   Wt 123 lb 11.2 oz (56.1 kg)   BMI 24.16 kg/m   Constitutional:  Alert and oriented, No acute distress. HEENT: [place] AT, moist mucus membranes.  Trachea midline, no masses. Cardiovascular: No clubbing, cyanosis, or edema. Respiratory: Normal respiratory effort, no increased work of breathing. GI: Abdomen is soft, nontender, nondistended, no abdominal masses GU: No CVA tenderness.  Skin: No rashes, bruises or suspicious lesions. Lymph: No cervical or inguinal adenopathy. Neurologic: Grossly intact, no focal deficits, moving all 4 extremities. Psychiatric: Normal mood and affect.  Laboratory Data: Lab Results  Component Value Date   WBC 6.3 11/16/2016   HGB 8.8 (L) 11/16/2016   HCT 28.4 (L) 11/16/2016   MCV 68.5 (L) 11/16/2016   PLT 268 11/16/2016    Lab Results  Component Value Date   CREATININE 1.30 (H) 11/20/2016    No results found for: PSA  No results found for: TESTOSTERONE  No results found for: HGBA1C  Urinalysis    Component Value Date/Time   COLORURINE Yellow 04/13/2013 0950   APPEARANCEUR Cloudy (A) 10/13/2016 1048   LABSPEC 1.019 04/13/2013 0950   PHURINE 5.0 04/13/2013 0950   GLUCOSEU Negative 10/13/2016 1048   GLUCOSEU Negative 04/13/2013 0950   HGBUR Negative 04/13/2013 0950   BILIRUBINUR Negative 10/13/2016 1048   BILIRUBINUR Negative 04/13/2013 0950   KETONESUR Negative 04/13/2013 0950   PROTEINUR 2+ (A) 10/13/2016 1048   PROTEINUR Negative 04/13/2013 0950   NITRITE Negative 10/13/2016 1048   NITRITE Negative 04/13/2013 0950   LEUKOCYTESUR 1+ (A) 10/13/2016 1048   LEUKOCYTESUR Negative 04/13/2013 0950     Assessment & Plan:     1. Left renal stone We'll continue active surveillance at this point. We'll repeat KUB in one year.  Return in about 1 year (around 05/12/2018) for KUB prior.  Nickie Retort, MD  [facility] [address], [place],  [address] [phone]

## 2017-06-06 ENCOUNTER — Encounter: Payer: Self-pay | Admitting: Emergency Medicine

## 2017-06-06 ENCOUNTER — Inpatient Hospital Stay
Admission: EM | Admit: 2017-06-06 | Discharge: 2017-06-10 | DRG: 378 | Disposition: A | Discharge status: Skilled Nursing Facility | Payer: Medicare Other | Attending: Internal Medicine | Admitting: Internal Medicine

## 2017-06-06 ENCOUNTER — Other Ambulatory Visit: Payer: Self-pay

## 2017-06-06 ENCOUNTER — Emergency Department: Payer: Medicare Other

## 2017-06-06 DIAGNOSIS — J45909 Unspecified asthma, uncomplicated: Secondary | ICD-10-CM | POA: Diagnosis present

## 2017-06-06 DIAGNOSIS — Z515 Encounter for palliative care: Secondary | ICD-10-CM

## 2017-06-06 DIAGNOSIS — R531 Weakness: Secondary | ICD-10-CM

## 2017-06-06 DIAGNOSIS — D500 Iron deficiency anemia secondary to blood loss (chronic): Secondary | ICD-10-CM | POA: Diagnosis not present

## 2017-06-06 DIAGNOSIS — Z79899 Other long term (current) drug therapy: Secondary | ICD-10-CM

## 2017-06-06 DIAGNOSIS — Z66 Do not resuscitate: Secondary | ICD-10-CM

## 2017-06-06 DIAGNOSIS — D649 Anemia, unspecified: Secondary | ICD-10-CM

## 2017-06-06 DIAGNOSIS — Z7189 Other specified counseling: Secondary | ICD-10-CM | POA: Diagnosis not present

## 2017-06-06 DIAGNOSIS — F329 Major depressive disorder, single episode, unspecified: Secondary | ICD-10-CM | POA: Diagnosis present

## 2017-06-06 DIAGNOSIS — K639 Disease of intestine, unspecified: Secondary | ICD-10-CM | POA: Diagnosis not present

## 2017-06-06 DIAGNOSIS — K6389 Other specified diseases of intestine: Secondary | ICD-10-CM | POA: Diagnosis present

## 2017-06-06 DIAGNOSIS — I248 Other forms of acute ischemic heart disease: Secondary | ICD-10-CM | POA: Diagnosis present

## 2017-06-06 DIAGNOSIS — I10 Essential (primary) hypertension: Secondary | ICD-10-CM | POA: Diagnosis present

## 2017-06-06 DIAGNOSIS — C787 Secondary malignant neoplasm of liver and intrahepatic bile duct: Secondary | ICD-10-CM | POA: Diagnosis present

## 2017-06-06 DIAGNOSIS — R410 Disorientation, unspecified: Secondary | ICD-10-CM | POA: Diagnosis not present

## 2017-06-06 DIAGNOSIS — C182 Malignant neoplasm of ascending colon: Secondary | ICD-10-CM

## 2017-06-06 DIAGNOSIS — D62 Acute posthemorrhagic anemia: Secondary | ICD-10-CM | POA: Diagnosis present

## 2017-06-06 DIAGNOSIS — K922 Gastrointestinal hemorrhage, unspecified: Secondary | ICD-10-CM

## 2017-06-06 DIAGNOSIS — R748 Abnormal levels of other serum enzymes: Secondary | ICD-10-CM | POA: Diagnosis not present

## 2017-06-06 DIAGNOSIS — R7989 Other specified abnormal findings of blood chemistry: Secondary | ICD-10-CM

## 2017-06-06 DIAGNOSIS — R1907 Generalized intra-abdominal and pelvic swelling, mass and lump: Secondary | ICD-10-CM | POA: Diagnosis not present

## 2017-06-06 DIAGNOSIS — K921 Melena: Secondary | ICD-10-CM | POA: Diagnosis present

## 2017-06-06 DIAGNOSIS — R778 Other specified abnormalities of plasma proteins: Secondary | ICD-10-CM

## 2017-06-06 DIAGNOSIS — C801 Malignant (primary) neoplasm, unspecified: Secondary | ICD-10-CM | POA: Diagnosis not present

## 2017-06-06 LAB — COMPREHENSIVE METABOLIC PANEL
ALBUMIN: 3.3 g/dL — AB (ref 3.5–5.0)
ALK PHOS: 118 U/L (ref 38–126)
ALT: 14 U/L (ref 14–54)
AST: 34 U/L (ref 15–41)
Anion gap: 9 (ref 5–15)
BILIRUBIN TOTAL: 0.7 mg/dL (ref 0.3–1.2)
BUN: 26 mg/dL — AB (ref 6–20)
CALCIUM: 9.4 mg/dL (ref 8.9–10.3)
CO2: 21 mmol/L — AB (ref 22–32)
Chloride: 105 mmol/L (ref 101–111)
Creatinine, Ser: 1.68 mg/dL — ABNORMAL HIGH (ref 0.44–1.00)
GFR calc Af Amer: 30 mL/min — ABNORMAL LOW (ref >60)
GFR calc non Af Amer: 26 mL/min — ABNORMAL LOW (ref >60)
GLUCOSE: 134 mg/dL — AB (ref 65–99)
Potassium: 4.3 mmol/L (ref 3.5–5.1)
SODIUM: 135 mmol/L (ref 135–145)
TOTAL PROTEIN: 7.5 g/dL (ref 6.5–8.1)

## 2017-06-06 LAB — TROPONIN I: Troponin I: 0.14 ng/mL (ref <0.03)

## 2017-06-06 LAB — CBC
HEMATOCRIT: 19.2 % — AB (ref 35.0–47.0)
HEMOGLOBIN: 5 g/dL — AB (ref 12.0–16.0)
MCH: 13.2 pg — ABNORMAL LOW (ref 26.0–34.0)
MCHC: 26 g/dL — ABNORMAL LOW (ref 32.0–36.0)
MCV: 50.8 fL — ABNORMAL LOW (ref 80.0–100.0)
Platelets: 337 10*3/uL (ref 150–440)
RBC: 3.79 MIL/uL — AB (ref 3.80–5.20)
RDW: 23.3 % — ABNORMAL HIGH (ref 11.5–14.5)
WBC: 7.7 10*3/uL (ref 3.6–11.0)

## 2017-06-06 LAB — PROTIME-INR
INR: 1.21
PROTHROMBIN TIME: 15.2 s (ref 11.4–15.2)

## 2017-06-06 LAB — ABO/RH: ABO/RH(D): B POS

## 2017-06-06 LAB — PREPARE RBC (CROSSMATCH)

## 2017-06-06 MED ORDER — PANTOPRAZOLE SODIUM 40 MG IV SOLR
40.0000 mg | Freq: Once | INTRAVENOUS | Status: AC
  Administered 2017-06-06: 40 mg via INTRAVENOUS
  Filled 2017-06-06 (×2): qty 40

## 2017-06-06 MED ORDER — DOCUSATE SODIUM 100 MG PO CAPS
100.0000 mg | ORAL_CAPSULE | Freq: Two times a day (BID) | ORAL | Status: DC
  Administered 2017-06-06 – 2017-06-10 (×8): 100 mg via ORAL
  Filled 2017-06-06 (×8): qty 1

## 2017-06-06 MED ORDER — ONDANSETRON HCL 4 MG/2ML IJ SOLN
4.0000 mg | Freq: Four times a day (QID) | INTRAMUSCULAR | Status: DC | PRN
Start: 2017-06-06 — End: 2017-06-10

## 2017-06-06 MED ORDER — ONDANSETRON HCL 4 MG PO TABS: 4.0000 mg | ORAL_TABLET | Freq: Four times a day (QID) | ORAL | Status: DC | PRN

## 2017-06-06 MED ORDER — METOPROLOL TARTRATE 25 MG PO TABS
25.0000 mg | ORAL_TABLET | Freq: Two times a day (BID) | ORAL | Status: DC
  Administered 2017-06-06 – 2017-06-10 (×9): 25 mg via ORAL
  Filled 2017-06-06 (×9): qty 1

## 2017-06-06 MED ORDER — ACETAMINOPHEN 325 MG PO TABS: 650.0000 mg | ORAL_TABLET | Freq: Four times a day (QID) | ORAL | Status: DC | PRN

## 2017-06-06 MED ORDER — ACETAMINOPHEN 650 MG RE SUPP: 650.0000 mg | Freq: Four times a day (QID) | RECTAL | Status: DC | PRN

## 2017-06-06 MED ORDER — PANTOPRAZOLE SODIUM 40 MG IV SOLR
40.0000 mg | Freq: Two times a day (BID) | INTRAVENOUS | Status: DC
  Administered 2017-06-06 – 2017-06-10 (×8): 40 mg via INTRAVENOUS
  Filled 2017-06-06 (×8): qty 40

## 2017-06-06 MED ORDER — SERTRALINE HCL 50 MG PO TABS
50.0000 mg | ORAL_TABLET | Freq: Every day | ORAL | Status: DC
  Administered 2017-06-07 – 2017-06-10 (×4): 50 mg via ORAL
  Filled 2017-06-06 (×4): qty 1

## 2017-06-06 MED ORDER — SODIUM CHLORIDE 0.9 % IV SOLN: 10.0000 mL/h | Freq: Once | INTRAVENOUS | Status: DC

## 2017-06-06 MED ORDER — ALBUTEROL SULFATE (2.5 MG/3ML) 0.083% IN NEBU: 2.5000 mg | INHALATION_SOLUTION | RESPIRATORY_TRACT | Status: DC | PRN

## 2017-06-06 MED ORDER — PRAVASTATIN SODIUM 40 MG PO TABS
40.0000 mg | ORAL_TABLET | Freq: Every day | ORAL | Status: DC
  Administered 2017-06-06 – 2017-06-10 (×5): 40 mg via ORAL
  Filled 2017-06-06 (×5): qty 1

## 2017-06-06 MED ORDER — BISACODYL 10 MG RE SUPP: 10.0000 mg | Freq: Every day | RECTAL | Status: DC | PRN

## 2017-06-06 MED ORDER — SODIUM CHLORIDE 0.9 % IV SOLN
INTRAVENOUS | Status: DC
  Administered 2017-06-06 – 2017-06-07 (×2): via INTRAVENOUS

## 2017-06-06 NOTE — Progress Notes (Signed)
Patient arrived to floor around 3:40pm. Was informed by blood bank that type and screen would need to be repeated as previous draw was hemolyzed. Lab arrived in room to redraw.

## 2017-06-06 NOTE — Consult Note (Signed)
[name]  CARDIOLOGY CONSULT NOTE  Patient ID: [name] MRN: [id number] DOB/AGE: 81-Oct-1928 81 y.o.  Admit date: 06/06/2017 Referring Physician Dr. Doy Hutching Primary Physician Dr. Candiss Norse Primary Cardiologist   Reason for Consultation elevated troponin  HPI: 83 yo with known colonic mass and chronic anemia admitted after presenting to the er with progressive weakness, anorexia, fatigue and dark stools. Was noted to have hgb of 5. Troponin was drawn in the er and was mildly elevated at 0.14. Denied chest pain and ekg nsr with no ischemia. No chf on cxr. Hemodynaically stable. Has no history of coronary artery disease and is pain free at present.   ROS  Past Medical History:  Diagnosis Date  . Asthma   . Depression   . Hypertension   . Memory deficit     Family History  Problem Relation Age of Onset  . Kidney disease Father   . Heart failure Mother   . Kidney disease Sister   . Bladder Cancer Neg Hx   . Kidney cancer Neg Hx     Social History   Socioeconomic History  . Marital status: Widowed    Spouse name: Not on file  . Number of children: Not on file  . Years of education: Not on file  . Highest education level: Not on file  Social Needs  . Financial resource strain: Not on file  . Food insecurity - worry: Not on file  . Food insecurity - inability: Not on file  . Transportation needs - medical: Not on file  . Transportation needs - non-medical: Not on file  Occupational History  . Not on file  Tobacco Use  . Smoking status: Never Smoker  . Smokeless tobacco: Never Used  Substance and Sexual Activity  . Alcohol use: No  . Drug use: No  . Sexual activity: No  Other Topics Concern  . Not on file  Social History Narrative  . Not on file    Past Surgical History:  Procedure Laterality Date  . ABDOMINAL HYSTERECTOMY    . COLONOSCOPY  2013  . JOINT REPLACEMENT Right 04/2013   Dr Marry Guan  . UPPER GI  ENDOSCOPY  2013     Medications Prior to Admission  Medication Sig Dispense Refill Last Dose  . albuterol (PROVENTIL HFA;VENTOLIN HFA) 108 (90 Base) MCG/ACT inhaler Inhale 2 puffs into the lungs every 4 (four) hours as needed.    PRN at PRN  . pantoprazole (PROTONIX) 40 MG tablet Take 40 mg by mouth daily.    06/06/2017 at 0800  . pravastatin (PRAVACHOL) 40 MG tablet Take 40 mg by mouth daily.   06/05/2017 at 2000  . sertraline (ZOLOFT) 50 MG tablet Take 50 mg by mouth daily.   06/06/2017 at 0800  . vitamin B-12 (CYANOCOBALAMIN) 500 MCG tablet Take 500 mcg by mouth daily.   06/06/2017 at 0800  . traMADol (ULTRAM) 50 MG tablet Take 1 tablet (50 mg total) by mouth every 12 (twelve) hours as needed for moderate pain. (Patient not taking: Reported on 05/12/2017) 12 tablet 0 Completed Course at Unknown time    Physical Exam: Blood pressure 132/68, pulse 82, temperature 98.1 F (36.7 C), temperature source Oral, resp. rate 16, height 5\' 2"  (1.575 m), weight 54.4 kg (120 lb), SpO2 100 %.   Wt Readings from Last 1 Encounters:  06/06/17 54.4 kg (120 lb)     General appearance: alert and cooperative Head: Normocephalic, without obvious abnormality,  atraumatic Resp: clear to auscultation bilaterally Cardio: regular rate and rhythm GI: soft, non-tender; bowel sounds normal; no masses,  no organomegaly Extremities: extremities normal, atraumatic, no cyanosis or edema Neurologic: Grossly normal    Labs:   Lab Results  Component Value Date   WBC 7.7 06/06/2017   HGB 5.0 (L) 06/06/2017   HCT 19.2 (L) 06/06/2017   MCV 50.8 (L) 06/06/2017   PLT 337 06/06/2017    Recent Labs  Lab 06/06/17 1205  NA 135  K 4.3  CL 105  CO2 21*  BUN 26*  CREATININE 1.68*  CALCIUM 9.4  PROT 7.5  BILITOT 0.7  ALKPHOS 118  ALT 14  AST 34  GLUCOSE 134*   Lab Results  Component Value Date   CKTOTAL 95 05/05/2013   CKMB 1.2 05/05/2013   TROPONINI 0.14 ([place]) 06/06/2017      Radiology: no acute  cardiopulmonary disease.  EKG: nsr  ASSESSMENT AND PLAN:  83 yo female with history of htn, chornic anemia and known colonic mass who was admitted with complaints of weakness and fatigue, weight loss, anorexia. She denies chest pain. SHe was noted ot have quaiac positive stools and hgb of 5. Tronponin was drawn perprotocol and was mildly elevated to 0.14. EKG shows nsr with no ischemia. She is hemodynamically stable. Elevated troponin appears to be demand secondary to severe anemia. NOt a candidate for invasive or noninvasive ischemic workup at present. Has ruled out for an mi thus far. Echo has been ordered. Will review when available. Not candidate for heparin, asa or plavix. Signed: Teodoro Spray MD, [facility] 06/06/2017, 7:25 PM

## 2017-06-06 NOTE — ED Notes (Signed)
Attempted report, RN states she will call back. 

## 2017-06-06 NOTE — Consult Note (Signed)
Renee Darby, MD [address]  [place]  [place], [place] [address]  Main: [phone]  Fax: [phone] Pager: [phone]   Consultation  Referring Provider:     No ref. provider found Primary Care Physician:  Renee Axe, MD Primary Gastroenterologist:  [name]        Reason for Consultation:  Anemia, dark stools  Date of Admission:  06/06/2017 Date of Consultation:  06/06/2017         HPI:   [name] is a 83 y.o. female with HTN, asthma otherwise healthy with known h/o large endoscopically unresectable complex polyp in ascending colon diagnosed in 2013 by Dr Tiffany Ortega. Per chart review from care everywhere, pt was not willing to undergo surgery despite multiple discussions with her. She is also seen by oncology and refused surgery. She has chronic IDA secondary blood loss from this mass. She had a  CT A/P in 11/2016 which revealed An annular constricting mass is seen involving the ascending colon which measures 3.1 x 5.1 cm. This shows extension into perirectal fat. No evidence bowel obstruction. She is not on oral iron as out pt. She also has B12 deficiency and takes oral B12. She now presented with progressively worsening weakness, fatigue, weight loss, anorexia. She is found to have Hb 5, received PRBCs. Hb 8.8 6 months ago.   NSAIDs: None  Antiplts/Anticoagulants/Anti thrombotics: None  GI Procedures: Colonoscopy on 2013, complex polyp/mass in [facility], path showed TA only  Past Medical History:  Diagnosis Date  . Asthma   . Depression   . Hypertension   . Memory deficit     Past Surgical History:  Procedure Laterality Date  . ABDOMINAL HYSTERECTOMY    . COLONOSCOPY  2013  . JOINT REPLACEMENT Right 04/2013   Dr Marry Guan  . UPPER GI ENDOSCOPY  2013    Prior to Admission medications   Medication Sig Start Date End Date Taking? Authorizing Provider  albuterol (PROVENTIL HFA;VENTOLIN HFA) 108 (90 Base) MCG/ACT inhaler Inhale 2 puffs into the lungs  every 4 (four) hours as needed.  10/21/16 10/21/17 Yes [provider]  pantoprazole (PROTONIX) 40 MG tablet Take 40 mg by mouth daily.  11/10/16  Yes [provider]  pravastatin (PRAVACHOL) 40 MG tablet Take 40 mg by mouth daily.   Yes [provider]  sertraline (ZOLOFT) 50 MG tablet Take 50 mg by mouth daily.   Yes [provider]  vitamin B-12 (CYANOCOBALAMIN) 500 MCG tablet Take 500 mcg by mouth daily.   Yes [provider]  traMADol (ULTRAM) 50 MG tablet Take 1 tablet (50 mg total) by mouth every 12 (twelve) hours as needed for moderate pain. Patient not taking: Reported on 05/12/2017 04/03/15   Sable Feil, PA-C    Family History  Problem Relation Age of Onset  . Kidney disease Father   . Heart failure Mother   . Kidney disease Sister   . Bladder Cancer Neg Hx   . Kidney cancer Neg Hx      Social History   Tobacco Use  . Smoking status: Never Smoker  . Smokeless tobacco: Never Used  Substance Use Topics  . Alcohol use: No  . Drug use: No    Allergies as of 06/06/2017 - Review Complete 06/06/2017  Allergen Reaction Noted  . Aspirin Nausea And Vomiting 04/03/2015  . Motrin [ibuprofen] Nausea And Vomiting 04/03/2015    Review of Systems:    All systems reviewed and negative except  where noted in HPI.   Physical Exam:  Vital signs in last 24 hours: Temp:  [97.9 F (36.6 C)-98.7 F (37.1 C)] 98.1 F (36.7 C) (11/25 1814) Pulse Rate:  [82-94] 82 (11/25 1814) Resp:  [16-20] 16 (11/25 1814) BP: (109-137)/(63-82) 132/68 (11/25 1814) SpO2:  [100 %] 100 % (11/25 1814) Weight:  [120 lb (54.4 kg)] 120 lb (54.4 kg) (11/25 1154) Last BM Date: 06/05/17 General:   Pleasant, cooperative in NAD Head:  Normocephalic and atraumatic. Eyes:   No icterus.   Conjunctiva pale. PERRLA. Ears:  Normal auditory acuity. Neck:  Supple; no masses or thyroidomegaly Lungs: Respirations even and unlabored. Lungs clear to auscultation bilaterally.    No wheezes, crackles, or rhonchi.  Heart:  Regular rate and rhythm;  Without murmur, clicks, rubs or gallops Abdomen:  Soft, nondistended, nontender. Normal bowel sounds. No appreciable masses or hepatomegaly.  No rebound or guarding.  Rectal:  Not performed. Msk:  Symmetrical without gross deformities.  Strength normal Extremities:  Without edema, cyanosis or clubbing. Neurologic:  Alert and oriented x3;  grossly normal neurologically. Skin:  Intact without significant lesions or rashes. Cervical Nodes:  No significant cervical adenopathy. Psych:  Alert and cooperative. Normal affect.  LAB RESULTS: CBC Latest Ref Rng & Units 06/06/2017 11/16/2016 05/05/2013  WBC 3.6 - 11.0 K/uL 7.7 6.3 8.7  Hemoglobin 12.0 - 16.0 g/dL 5.0(L) 8.8(L) 8.7(L)  Hematocrit 35.0 - 47.0 % 19.2(L) 28.4(L) 27.1(L)  Platelets 150 - 440 K/uL 337 268 370    BMET BMP Latest Ref Rng & Units 06/06/2017 11/20/2016 04/15/2016  Glucose 65 - 99 mg/dL 134(H) - -  BUN 6 - 20 mg/dL 26(H) - -  Creatinine 0.44 - 1.00 mg/dL 1.68(H) 1.30(H) 1.20(H)  BUN/Creat Ratio 12 - 28 - - -  Sodium 135 - 145 mmol/L 135 - -  Potassium 3.5 - 5.1 mmol/L 4.3 - -  Chloride 101 - 111 mmol/L 105 - -  CO2 22 - 32 mmol/L 21(L) - -  Calcium 8.9 - 10.3 mg/dL 9.4 - -    LFT Hepatic Function Latest Ref Rng & Units 06/06/2017 05/05/2013 10/07/2012  Total Protein 6.5 - 8.1 g/dL 7.5 7.6 7.0  Albumin 3.5 - 5.0 g/dL 3.3(L) 3.0(L) 3.0(L)  AST 15 - 41 U/L 34 26 42(H)  ALT 14 - 54 U/L 14 20 43  Alk Phosphatase 38 - 126 U/L 118 122 168(H)  Total Bilirubin 0.3 - 1.2 mg/dL 0.7 0.3 0.5     STUDIES: Dg Chest Portable 1 View  Result Date: 06/06/2017 CLINICAL DATA:  83 year old female with poor appetite and weakness for the past 3 weeks. EXAM: PORTABLE CHEST 1 VIEW COMPARISON:  Chest x-ray 09/14/2014. FINDINGS: Lung volumes are normal. No consolidative airspace disease. No pleural effusions. No pneumothorax. No pulmonary nodule or mass noted. Pulmonary  vasculature and the cardiomediastinal silhouette are within normal limits. Atherosclerosis in the thoracic aorta. IMPRESSION: 1.  No radiographic evidence of acute cardiopulmonary disease. 2. Aortic atherosclerosis. Electronically Signed   By: Vinnie Langton M.D.   On: 06/06/2017 14:20      Impression / Plan:   [name] is a 83 y.o. female with HTN, asthma, right colon mass diagnosed in 2013, refused to undergo surgical resection in the past, chronic B12 and iron deficiency anemia presents with worsening symptomatic anemia. This is secondary to blood loss from colon tumor. Receiving PRBCs. She will need oral or IV iron long term. Discussed with her that she should talk to surgeon atleast  to understand risks and benefits of surgery. I imagine, she might be a good surgical candidate other than her age. If she is adamantly refusing surgical resection, recommend palliative care consult. She will not benefit from further GI evaluation as this point.   Thank you for involving me in the care of this patient.      LOS: 0 days   Sherri Sear, MD  06/06/2017, 6:15 PM   Note: This dictation was prepared with Dragon dictation along with smaller phrase technology. Any transcriptional errors that result from this process are unintentional.

## 2017-06-06 NOTE — Plan of Care (Signed)
  Fluid Volume: Will show no signs and symptoms of excessive bleeding -Patient currently has had no BMs or signs of bleeding since arriving to floor. Hgb in ER was 5.0. First unit of PRBCs currently transfusing with no signs of reaction. Will continue to monitor.  06/06/2017 1829 - Progressing by Shary Key, RN

## 2017-06-06 NOTE — ED Triage Notes (Addendum)
Patient arrives to [facility] ED with family who states that the patient is having "breathing difficulties". However, patient (whom is A+Ox4) only complains of "poor appetite" and "weakness" for 3 weeks. Patient denies chest pain and shortness of breath.

## 2017-06-06 NOTE — H&P (Signed)
History and Physical    [name] LKT:[id number] DOB: 08-10-1926 DOA: 06/06/2017  Referring physician: Dr. Quentin Cornwall PCP: Glendon Axe, MD  Specialists: Dr. Vira Agar  Chief Complaint: weakness  HPI: [name] is a 83 y.o. female has a past medical history significant for HTN, asthma and chronic anemia and known colonic mass now with progressive weakness with anorexia and weight loss. In ER, pt noted to have hgb=5.0. No fever. Denies CP or SOB. No N/V/D. Stool is guaiac positive and troponin is elevated. She is now admitted. Denies abdominal pain. Bowels are "dark".  Review of Systems: The patient denies  fever, vision loss, decreased hearing, hoarseness, chest pain, syncope, dyspnea on exertion, peripheral edema, balance deficits, hemoptysis, abdominal pain, melena, hematochezia, severe indigestion/heartburn, hematuria, incontinence, genital sores, suspicious skin lesions, transient blindness,  depression, unusual weight change, abnormal bleeding, enlarged lymph nodes, angioedema, and breast masses.   Past Medical History:  Diagnosis Date  . Asthma   . Depression   . Hypertension   . Memory deficit    Past Surgical History:  Procedure Laterality Date  . ABDOMINAL HYSTERECTOMY    . COLONOSCOPY  2013  . JOINT REPLACEMENT Right 04/2013   Dr Marry Guan  . UPPER GI ENDOSCOPY  2013   Social History:  reports that  has never smoked. she has never used smokeless tobacco. She reports that she does not drink alcohol or use drugs.  Allergies  Allergen Reactions  . Aspirin Nausea And Vomiting  . Motrin [Ibuprofen] Nausea And Vomiting    Family History  Problem Relation Age of Onset  . Kidney disease Father   . Heart failure Mother   . Kidney disease Sister   . Bladder Cancer Neg Hx   . Kidney cancer Neg Hx     Prior to Admission medications   Medication Sig Start Date End Date Taking? Authorizing Provider  albuterol (PROVENTIL HFA;VENTOLIN HFA) 108 (90 Base) MCG/ACT  inhaler Inhale 2 puffs into the lungs every 4 (four) hours as needed.  10/21/16 10/21/17 Yes [provider]  pantoprazole (PROTONIX) 40 MG tablet Take 40 mg by mouth daily.  11/10/16  Yes [provider]  pravastatin (PRAVACHOL) 40 MG tablet Take 40 mg by mouth daily.   Yes [provider]  sertraline (ZOLOFT) 50 MG tablet Take 50 mg by mouth daily.   Yes [provider]  amLODipine (NORVASC) 5 MG tablet Take 5 mg by mouth daily.    [provider]  IRON PO Take 65 mg by mouth every morning.    [provider]  mometasone [facility]) 220 MCG/INH inhaler Inhale into the lungs. 10/21/16 10/21/17  [provider]  traMADol (ULTRAM) 50 MG tablet Take 1 tablet (50 mg total) by mouth every 12 (twelve) hours as needed for moderate pain. Patient not taking: Reported on 05/12/2017 04/03/15   Sable Feil, PA-C  vitamin B-12 (CYANOCOBALAMIN) 500 MCG tablet Take 500 mcg by mouth daily.    [provider]   Physical Exam: Vitals:   06/06/17 1154 06/06/17 1359  BP:  137/82  Pulse:  92  Resp:  20  Temp:  98.7 F (37.1 C)  TempSrc:  Oral  SpO2:  100%  Weight: 54.4 kg (120 lb)   Height: 5\' 2"  (1.575 m)      General:  No apparent distress, WDWN, [place]/AT  Eyes: PERRL, EOMI, no scleral icterus, conjunctiva pale  ENT: moist oropharynx without exudate, TM's benign, dentition fair  Neck: supple, no lymphadenopathy.  No bruits or thyromegaly  Cardiovascular: regular rate without MRG; 2+ peripheral pulses, no JVD, no peripheral edema  Respiratory: CTA biL, good air movement without wheezing, rhonchi or crackled. Respiratory effort normal  Abdomen: soft, non tender to palpation, positive bowel sounds, no guarding, no rebound  Skin: no rashes or lesions  Musculoskeletal: normal bulk and tone, no joint swelling  Psychiatric: normal mood and affect, A&OX3  Neurologic: CN 2-12 grossly intact, Motor strength 5/5 in all 4 groups with  symmetric DTR's and non-focal sensory exam  Labs on Admission:  Basic Metabolic Panel: Recent Labs  Lab 06/06/17 1205  NA 135  K 4.3  CL 105  CO2 21*  GLUCOSE 134*  BUN 26*  CREATININE 1.68*  CALCIUM 9.4   Liver Function Tests: Recent Labs  Lab 06/06/17 1205  AST 34  ALT 14  ALKPHOS 118  BILITOT 0.7  PROT 7.5  ALBUMIN 3.3*   No results for input(s): LIPASE, AMYLASE in the last 168 hours. No results for input(s): AMMONIA in the last 168 hours. CBC: Recent Labs  Lab 06/06/17 1205  WBC 7.7  HGB 5.0*  HCT 19.2*  MCV 50.8*  PLT 337   Cardiac Enzymes: Recent Labs  Lab 06/06/17 1205  TROPONINI 0.14*    BNP (last 3 results) No results for input(s): BNP in the last 8760 hours.  ProBNP (last 3 results) No results for input(s): PROBNP in the last 8760 hours.  CBG: No results for input(s): GLUCAP in the last 168 hours.  Radiological Exams on Admission: No results found.  EKG: Independently reviewed.  Assessment/Plan Principal Problem:   Symptomatic anemia Active Problems:   Colonic mass   GI bleed   Elevated troponin   Will admit to floor and transfuse 2u PRBC's. Follow hgb closely. Follow enzymes. Order echo. Consult GI, Cardiology, and Palliative Care. Repeat labs in AM  Diet: clear liquids Fluids: NS@50  DVT Prophylaxis: TED hose  Code Status: FULL  Family Communication: yes  Disposition Plan: home  Time spent: 50 min

## 2017-06-06 NOTE — Progress Notes (Signed)
Type and screen has resulted however blood is not allocated at this time. Will continue to check back. Patient resting comfortably in bed. Will continue to assess and monitor.

## 2017-06-06 NOTE — ED Provider Notes (Addendum)
[facility] Emergency Department Provider Note    First MD Initiated Contact with Patient 06/06/17 1328     (approximate)  I have reviewed the triage vital signs and the nursing notes.   HISTORY  Chief Complaint Failure To Thrive    HPI [name] is a 83 y.o. female with a history of asthma hypertension and reported history of colon cancer not currently undergoing any therapy or surgery presents with chief complaint of generalized weakness intermittent shortness of breath that became most notable today but has been ongoing for the past 3 weeks.  Denies any fevers.  No nausea or vomiting.  Has noted some dark stools.  Is not on any blood thinners.  Does not know of any history of anemia.  Past Medical History:  Diagnosis Date  . Asthma   . Depression   . Hypertension   . Memory deficit    Family History  Problem Relation Age of Onset  . Kidney disease Father   . Heart failure Mother   . Kidney disease Sister   . Bladder Cancer Neg Hx   . Kidney cancer Neg Hx    Past Surgical History:  Procedure Laterality Date  . ABDOMINAL HYSTERECTOMY    . COLONOSCOPY  2013  . JOINT REPLACEMENT Right 04/2013   Dr Marry Guan  . UPPER GI ENDOSCOPY  2013   Patient Active Problem List   Diagnosis Date Noted  . Colonic mass 11/03/2016  . Candida UTI 03/24/2016  . Imbalance 12/13/2015  . Reactive airway disease without complication 93/81/8299  . Depression, major, single episode, complete remission ([place]) 10/01/2015  . Essential hypertension 10/01/2015  . Gastroesophageal reflux disease without esophagitis 06/10/2015  . Primary osteoarthritis of left shoulder 06/10/2015  . Seasonal allergic rhinitis 06/10/2015  . Chronic anemia 12/01/2014  . Mild intermittent reactive airway disease without complication 37/16/9678  . Vitamin B 12 deficiency 12/01/2014  . Depressive disorder 11/29/2014  . Other allergic rhinitis 11/29/2014  . H/O iron deficiency anemia  06/20/2014  . Benign essential HTN 04/11/2014  . Chronic kidney disease (CKD), stage III (moderate) ([place]) 04/11/2014  . Mixed hyperlipidemia 04/11/2014  . Valvular heart disease 04/11/2014  . Right hip pain 01/17/2014  . Osteoarthritis of right hip 01/02/2014  . Fall 10/14/2012  . H/O arthroscopy of right knee 10/14/2012  . Hyperlipidemia 10/14/2012  . Hypertension 10/14/2012  . Osteoarthritis of right knee 10/14/2012      Prior to Admission medications   Medication Sig Start Date End Date Taking? Authorizing Provider  albuterol (PROVENTIL HFA;VENTOLIN HFA) 108 (90 Base) MCG/ACT inhaler Inhale 2 puffs into the lungs every 4 (four) hours as needed.  10/21/16 10/21/17  [provider]  amLODipine (NORVASC) 5 MG tablet Take 5 mg by mouth daily.    [provider]  IRON PO Take 65 mg by mouth every morning.    [provider]  mometasone [facility]) 220 MCG/INH inhaler Inhale into the lungs. 10/21/16 10/21/17  [provider]  pantoprazole (PROTONIX) 40 MG tablet Take by mouth. 11/10/16   [provider]  pravastatin (PRAVACHOL) 40 MG tablet Take 40 mg by mouth daily.    [provider]  sertraline (ZOLOFT) 50 MG tablet Take 50 mg by mouth daily.    [provider]  traMADol (ULTRAM) 50 MG tablet Take 1 tablet (50 mg total) by mouth every 12 (twelve) hours as needed for moderate pain. Patient not taking: Reported on 05/12/2017 04/03/15   Sable Feil,  PA-C  vitamin B-12 (CYANOCOBALAMIN) 500 MCG tablet Take 500 mcg by mouth daily.    [provider]    Allergies Aspirin and Motrin [ibuprofen]    Social History Social History   Tobacco Use  . Smoking status: Never Smoker  . Smokeless tobacco: Never Used  Substance Use Topics  . Alcohol use: No  . Drug use: No    Review of Systems Patient denies headaches, rhinorrhea, blurry vision, numbness, shortness of breath, chest pain, edema, cough, abdominal pain, nausea,  vomiting, diarrhea, dysuria, fevers, rashes or hallucinations unless otherwise stated above in HPI. ____________________________________________   PHYSICAL EXAM:  VITAL SIGNS: There were no vitals filed for this visit.  Constitutional: Alert and oriented. weak appearing and in no acute distress. Eyes: Conjunctivae are pale Head: Atraumatic. Nose: No congestion/rhinnorhea. Mouth/Throat: Mucous membranes are moist.   Neck: No stridor. Painless ROM.  Cardiovascular: Normal rate, regular rhythm. Grossly normal heart sounds.  Good peripheral circulation. Respiratory: Normal respiratory effort.  No retractions. Lungs CTAB. Gastrointestinal: Soft and nontender. No distention. No abdominal bruits. No CVA tenderness. Genitourinary: Dark stool on digital exam with guaiac positive stools Musculoskeletal: No lower extremity tenderness nor edema.  No joint effusions. Neurologic:  Normal speech and language. No gross focal neurologic deficits are appreciated. No facial droop Skin:  Skin is warm, dry and intact. No rash noted. Psychiatric: Mood and affect are normal. Speech and behavior are normal.  ____________________________________________   LABS (all labs ordered are listed, but only abnormal results are displayed)  Results for orders placed or performed during the hospital encounter of 06/06/17 (from the past 24 hour(s))  CBC     Status: Abnormal   Collection Time: 06/06/17 12:05 PM  Result Value Ref Range   WBC 7.7 3.6 - 11.0 K/uL   RBC 3.79 (L) 3.80 - 5.20 MIL/uL   Hemoglobin 5.0 (L) 12.0 - 16.0 g/dL   HCT 19.2 (L) 35.0 - 47.0 %   MCV 50.8 (L) 80.0 - 100.0 fL   MCH 13.2 (L) 26.0 - 34.0 pg   MCHC 26.0 (L) 32.0 - 36.0 g/dL   RDW 23.3 (H) 11.5 - 14.5 %   Platelets 337 150 - 440 K/uL  Comprehensive metabolic panel     Status: Abnormal   Collection Time: 06/06/17 12:05 PM  Result Value Ref Range   Sodium 135 135 - 145 mmol/L   Potassium 4.3 3.5 - 5.1 mmol/L   Chloride 105 101 - 111  mmol/L   CO2 21 (L) 22 - 32 mmol/L   Glucose, Bld 134 (H) 65 - 99 mg/dL   BUN 26 (H) 6 - 20 mg/dL   Creatinine, Ser 1.68 (H) 0.44 - 1.00 mg/dL   Calcium 9.4 8.9 - 10.3 mg/dL   Total Protein 7.5 6.5 - 8.1 g/dL   Albumin 3.3 (L) 3.5 - 5.0 g/dL   AST 34 15 - 41 U/L   ALT 14 14 - 54 U/L   Alkaline Phosphatase 118 38 - 126 U/L   Total Bilirubin 0.7 0.3 - 1.2 mg/dL   GFR calc non Af Amer 26 (L) >60 mL/min   GFR calc Af Amer 30 (L) >60 mL/min   Anion gap 9 5 - 15  Troponin I     Status: Abnormal   Collection Time: 06/06/17 12:05 PM  Result Value Ref Range   Troponin I 0.14 (HH) <0.03 ng/mL   ____________________________________________  EKG My review and personal interpretation at Time: 13:41   Indication: weakness  Rate:  90  Rhythm: sinus Axis: normal Other: nonspecific t changes, no stemi, ____________________________________________  RADIOLOGY  I personally reviewed all radiographic images ordered to evaluate for the above acute complaints and reviewed radiology reports and findings.  These findings were personally discussed with the patient.  Please see medical record for radiology report.  ____________________________________________   PROCEDURES  Procedure(s) performed:  .Critical Care Performed by: Merlyn Lot, MD Authorized by: Merlyn Lot, MD   Critical care provider statement:    Critical care time (minutes):  30   Critical care time was exclusive of:  Separately billable procedures and treating other patients   Critical care was necessary to treat or prevent imminent or life-threatening deterioration of the following conditions: acute symptomatic anemia/ GI bleed.   Critical care was time spent personally by me on the following activities:  Development of treatment plan with patient or surrogate, discussions with consultants, evaluation of patient's response to treatment, examination of patient, obtaining history from patient or surrogate, ordering and  performing treatments and interventions, ordering and review of laboratory studies, ordering and review of radiographic studies, pulse oximetry, re-evaluation of patient's condition and review of old charts       Critical Care performed: yes ____________________________________________   INITIAL IMPRESSION / [place] / ED COURSE  Pertinent labs & imaging results that were available during my care of the patient were reviewed by me and considered in my medical decision making (see chart for details).  DDX: Acute blood loss anemia, GI bleed, aplastic anemia, malignancy, ACS, CHF, sepsis  [name] is a 83 y.o. who presents to the ED with complaint of weakness and fatigue as described above.  Patient with evidence of acute blood loss anemia most likely secondary to colonic mass.  She is not on any anticoagulation.  She does have an elevated troponin with no significant EKG changes.  Most likely secondary to demand ischemia in the setting of significant anemia.  Based on her symptoms fail to do feel patient will require admission the hospital for transfusion which I have initiated here in the ER.  Spoke with Dr. Doy Hutching of hospitalist group who kindly agrees to admit patient for further evaluation and management.  Have discussed with the patient and available family all diagnostics and treatments performed thus far and all questions were answered to the best of my ability. The patient demonstrates understanding and agreement with plan.       ____________________________________________   FINAL CLINICAL IMPRESSION(S) / ED DIAGNOSES  Final diagnoses:  Acute blood loss anemia  Weakness  Elevated troponin I level      NEW MEDICATIONS STARTED DURING THIS VISIT:  This SmartLink is deprecated. Use AVSMEDLIST instead to display the medication list for a patient.   Note:  This document was prepared using Dragon voice recognition software and may include unintentional  dictation errors.    Merlyn Lot, MD 06/06/17 1351    Merlyn Lot, MD 06/15/17 [phone]

## 2017-06-07 ENCOUNTER — Inpatient Hospital Stay
Admit: 2017-06-07 | Discharge: 2017-06-07 | Disposition: A | Discharge status: Home / Self Care | Payer: Medicare Other | Attending: Internal Medicine | Admitting: Internal Medicine

## 2017-06-07 DIAGNOSIS — D500 Iron deficiency anemia secondary to blood loss (chronic): Secondary | ICD-10-CM

## 2017-06-07 DIAGNOSIS — Z7189 Other specified counseling: Secondary | ICD-10-CM

## 2017-06-07 DIAGNOSIS — D62 Acute posthemorrhagic anemia: Secondary | ICD-10-CM

## 2017-06-07 DIAGNOSIS — D649 Anemia, unspecified: Secondary | ICD-10-CM

## 2017-06-07 DIAGNOSIS — Z515 Encounter for palliative care: Secondary | ICD-10-CM

## 2017-06-07 DIAGNOSIS — K639 Disease of intestine, unspecified: Secondary | ICD-10-CM

## 2017-06-07 DIAGNOSIS — R748 Abnormal levels of other serum enzymes: Secondary | ICD-10-CM

## 2017-06-07 LAB — TYPE AND SCREEN
ABO/RH(D): B POS
Antibody Screen: NEGATIVE
Unit division: 0
Unit division: 0

## 2017-06-07 LAB — COMPREHENSIVE METABOLIC PANEL
ALK PHOS: 109 U/L (ref 38–126)
ALT: 21 U/L (ref 14–54)
AST: 43 U/L — AB (ref 15–41)
Albumin: 2.8 g/dL — ABNORMAL LOW (ref 3.5–5.0)
Anion gap: 6 (ref 5–15)
BUN: 22 mg/dL — AB (ref 6–20)
CALCIUM: 9 mg/dL (ref 8.9–10.3)
CHLORIDE: 107 mmol/L (ref 101–111)
CO2: 22 mmol/L (ref 22–32)
CREATININE: 1.29 mg/dL — AB (ref 0.44–1.00)
GFR calc non Af Amer: 36 mL/min — ABNORMAL LOW (ref >60)
GFR, EST AFRICAN AMERICAN: 42 mL/min — AB (ref >60)
Glucose, Bld: 81 mg/dL (ref 65–99)
Potassium: 4.6 mmol/L (ref 3.5–5.1)
SODIUM: 135 mmol/L (ref 135–145)
Total Bilirubin: 1.6 mg/dL — ABNORMAL HIGH (ref 0.3–1.2)
Total Protein: 6.8 g/dL (ref 6.5–8.1)

## 2017-06-07 LAB — CBC
HCT: 28.1 % — ABNORMAL LOW (ref 35.0–47.0)
HEMOGLOBIN: 8.2 g/dL — AB (ref 12.0–16.0)
MCH: 18.1 pg — AB (ref 26.0–34.0)
MCHC: 29.1 g/dL — ABNORMAL LOW (ref 32.0–36.0)
MCV: 62.1 fL — AB (ref 80.0–100.0)
PLATELETS: 265 10*3/uL (ref 150–440)
RBC: 4.51 MIL/uL (ref 3.80–5.20)
RDW: 37.4 % — ABNORMAL HIGH (ref 11.5–14.5)
WBC: 8.5 10*3/uL (ref 3.6–11.0)

## 2017-06-07 LAB — FOLATE: Folate: 10.6 ng/mL (ref >5.9)

## 2017-06-07 LAB — BPAM RBC
BLOOD PRODUCT EXPIRATION DATE: 201812192359
BLOOD PRODUCT EXPIRATION DATE: 201812192359
ISSUE DATE / TIME: 201811251733
ISSUE DATE / TIME: 201811252027
UNIT TYPE AND RH: 7300
Unit Type and Rh: 7300

## 2017-06-07 LAB — HEMOGLOBIN: Hemoglobin: 7.7 g/dL — ABNORMAL LOW (ref 12.0–16.0)

## 2017-06-07 LAB — ECHOCARDIOGRAM COMPLETE
HEIGHTINCHES: 62 in
WEIGHTICAEL: 1918.4 [oz_av]

## 2017-06-07 LAB — TROPONIN I
TROPONIN I: 0.12 ng/mL — AB (ref <0.03)
TROPONIN I: 0.14 ng/mL — AB (ref <0.03)
Troponin I: 0.13 ng/mL (ref <0.03)

## 2017-06-07 LAB — VITAMIN B12: VITAMIN B 12: 1047 pg/mL — AB (ref 180–914)

## 2017-06-07 LAB — IRON AND TIBC
Iron: 16 ug/dL — ABNORMAL LOW (ref 28–170)
Saturation Ratios: 4 % — ABNORMAL LOW (ref 10.4–31.8)
TIBC: 412 ug/dL (ref 250–450)
UIBC: 396 ug/dL

## 2017-06-07 LAB — MAGNESIUM: MAGNESIUM: 1.9 mg/dL (ref 1.7–2.4)

## 2017-06-07 LAB — FERRITIN: FERRITIN: 13 ng/mL (ref 11–307)

## 2017-06-07 MED ORDER — ENSURE ENLIVE PO LIQD
237.0000 mL | Freq: Two times a day (BID) | ORAL | Status: DC
  Administered 2017-06-08 – 2017-06-10 (×5): 237 mL via ORAL

## 2017-06-07 MED ORDER — DRONABINOL 2.5 MG PO CAPS
2.5000 mg | ORAL_CAPSULE | Freq: Two times a day (BID) | ORAL | Status: DC
  Administered 2017-06-07 – 2017-06-09 (×6): 2.5 mg via ORAL
  Filled 2017-06-07 (×6): qty 1

## 2017-06-07 MED ORDER — ADULT MULTIVITAMIN W/MINERALS CH
1.0000 | ORAL_TABLET | Freq: Every day | ORAL | Status: DC
  Administered 2017-06-07 – 2017-06-10 (×4): 1 via ORAL
  Filled 2017-06-07 (×4): qty 1

## 2017-06-07 MED ORDER — SODIUM CHLORIDE 0.9% FLUSH
3.0000 mL | Freq: Two times a day (BID) | INTRAVENOUS | Status: DC
  Administered 2017-06-07 – 2017-06-10 (×6): 3 mL via INTRAVENOUS

## 2017-06-07 NOTE — Consult Note (Signed)
Consultation Note Date: 06/07/2017   Patient Name: [name]  DOB: 05-27-1927  MRN: [id number]  Age / Sex: 83 y.o., female  PCP: Glendon Axe, MD Referring Physician: Nicholes Mango, MD  Reason for Consultation: Establishing goals of care  HPI/Patient Profile: 83 y.o. female  with past medical history of an ascending colon mass, memory deficit, asthma, and HTN who was admitted on 06/06/2017 with weakness and anorexia.  Her hemoglobin was 5.0.  Her troponin was 0.14.  She has received blood.  GI was consulted.  The cause for her blood loss appears to be her ascending colon mass was is now 5 cm x 3 cm.   Clinical Assessment and Goals of Care:  I have reviewed medical records including EPIC notes, labs and imaging, received report from the care team, assessed the patient and then met at the bedside along with her son Chrissie Noa, to discuss diagnosis prognosis, [place], EOL wishes, disposition and options.  I introduced Palliative Medicine as specialized medical care for people living with serious illness. It focuses on providing relief from the symptoms and stress of a serious illness. The goal is to improve quality of life for both the patient and the family.  We discussed a brief life review of the patient. She lives independently.  Utilizes a rolling walker and does not drive.  3 years ago right knee surgery made her somewhat incapacitated.  This past week she has felt very poorly after a fall last Wednesday (she hurt her right side on a table).  Recently she has not been eating well - she simply has had no appetite.  She is methodist and derives strength from her religion.  Chrissie Noa is her only son.  She would want him to make medical decisions for her if she could not.  We discussed their current illness and what it means in the larger context of their on-going co-morbidities.  Natural disease trajectory and  expectations at EOL were discussed.  Specifically we discussed that she had reached a critical decision point.  While she has not wanted the colon surgery in the past - she stated she may want it at some point in the future.  We discussed that the tumor is bleeding and she will continue to decline despite blood transfusions.  If she is going to consider surgery - then now is the time to consider it - from here on out she will only become more weak.   After some discussion with the patient and her son, Chrissie Noa, the patient decided that she wanted cardiac work up to determine whether or not she was eligible for bowel surgery.  I attempted to elicit values and goals of care important to the patient.  While she would not want to live in pain or be dependent (having to depend on others permanently to bathe and toilet her) right now she has good quality of life.  For these reasons she would like to be a full code.  She would accept CPR, shock, intubation.  We discussed  a feeding tube - which she said she would need to think about.  Chrissie Noa asked about the convergence of spirituality and medicine.  When faced with a code status decision for himself after MI - he had chosen NO CODE for spiritual reasons.   I gave Chrissie Noa a "Hard Choices" booklet for more clarification around the convergence of spirituality and medicine.  Chrissie Noa expressed concerns about "bed-side manner" and the ability of doctors to communicate with his mother (and himself).  He feels that communication makes a big difference in the end result of medical intervention.  He also expressed an interest in holistic medicine.  Questions and concerns were addressed.  Hard Choices booklet left for review. The family was encouraged to call with questions or concerns.    Primary Decision Maker:  PATIENT and son Chrissie Noa [phone]    SUMMARY OF RECOMMENDATIONS    Move forward with cardiac evaluation to determine cardiac risk during surgery.   If the  patient can move forward with GI surgery she would like to do so. Palliative will continue to follow with you.   Code Status/Advance Care Planning:  Full code.   Symptom Management:   Marinol for appetite stimulation   PT evaluation for mobility and right leg exercises   Psycho-social/Spiritual:   Desire for further Chaplaincy support: yes methodist.  Requested.  Prognosis: unable to determine.      Discharge Planning: To Be Determined      Primary Diagnoses: Present on Admission: . Colonic mass   I have reviewed the medical record, interviewed the patient and family, and examined the patient. The following aspects are pertinent.  Past Medical History:  Diagnosis Date  . Asthma   . Depression   . Hypertension   . Memory deficit    Social History   Socioeconomic History  . Marital status: Widowed    Spouse name: None  . Number of children: None  . Years of education: None  . Highest education level: None  Social Needs  . Financial resource strain: None  . Food insecurity - worry: None  . Food insecurity - inability: None  . Transportation needs - medical: None  . Transportation needs - non-medical: None  Occupational History  . None  Tobacco Use  . Smoking status: Never Smoker  . Smokeless tobacco: Never Used  Substance and Sexual Activity  . Alcohol use: No  . Drug use: No  . Sexual activity: No  Other Topics Concern  . None  Social History Narrative  . None   Family History  Problem Relation Age of Onset  . Kidney disease Father   . Heart failure Mother   . Kidney disease Sister   . Bladder Cancer Neg Hx   . Kidney cancer Neg Hx    Scheduled Meds: . docusate sodium  100 mg Oral BID  . metoprolol tartrate  25 mg Oral BID  . pantoprazole (PROTONIX) IV  40 mg Intravenous Q12H  . pravastatin  40 mg Oral Daily  . sertraline  50 mg Oral Daily   Continuous Infusions: . sodium chloride 50 mL/hr at 06/06/17 1558   PRN  Meds:.acetaminophen **OR** acetaminophen, albuterol, bisacodyl, ondansetron **OR** ondansetron (ZOFRAN) IV Allergies  Allergen Reactions  . Aspirin Nausea And Vomiting  . Motrin [Ibuprofen] Nausea And Vomiting   Review of Systems anorexia, weakness, no constipation, dark stools.  Patient with memory issues.  Physical Exam  Well developed, awake, alert, orientated, cooperative, NAD.  Vital Signs: BP 140/75 (BP Location: Right  Arm)   Pulse 83   Temp 98.2 F (36.8 C) (Oral)   Resp 18   Ht 5' 2"  (1.575 m)   Wt 54.4 kg (119 lb 14.4 oz)   SpO2 97%   BMI 21.93 kg/m  Pain Assessment: No/denies pain POSS *See Group Information*: 1-Acceptable,Awake and alert     SpO2: SpO2: 97 % O2 Device:SpO2: 97 % O2 Flow Rate: .   IO: Intake/output summary:   Intake/Output Summary (Last 24 hours) at 06/07/2017 1118 Last data filed at 06/07/2017 1753 Gross per 24 hour  Intake 1488.33 ml  Output -  Net 1488.33 ml    LBM: Last BM Date: 06/07/17 Baseline Weight: Weight: 54.4 kg (120 lb) Most recent weight: Weight: 54.4 kg (119 lb 14.4 oz)     Palliative Assessment/Data:70     Time In: 10:00 Time Out: 11:30 Time Total: 90 min. Greater than 50%  of this time was spent counseling and coordinating care related to the above assessment and plan.  Signed by: Florentina Jenny, PA-C Palliative Medicine Pager: [phone]  Please contact Palliative Medicine Team phone at [phone] for questions and concerns.  For individual provider: See Shea Evans

## 2017-06-07 NOTE — Plan of Care (Signed)
  Progressing Education: Knowledge of General Education information will improve 06/07/2017 1225 - Progressing by Darrelyn Hillock, RN Health Behavior/Discharge Planning: Ability to manage health-related needs will improve 06/07/2017 1225 - Progressing by Darrelyn Hillock, RN Clinical Measurements: Ability to maintain clinical measurements within normal limits will improve 06/07/2017 1225 - Progressing by Darrelyn Hillock, RN Will remain free from infection 06/07/2017 1225 - Progressing by Darrelyn Hillock, RN Diagnostic test results will improve 06/07/2017 1225 - Progressing by Darrelyn Hillock, RN Respiratory complications will improve 06/07/2017 1225 - Progressing by Darrelyn Hillock, RN Cardiovascular complication will be avoided 06/07/2017 1225 - Progressing by Darrelyn Hillock, RN Activity: Risk for activity intolerance will decrease 06/07/2017 1225 - Progressing by Darrelyn Hillock, RN Nutrition: Adequate nutrition will be maintained 06/07/2017 1225 - Progressing by Darrelyn Hillock, RN Coping: Level of anxiety will decrease 06/07/2017 1225 - Progressing by Darrelyn Hillock, RN Elimination: Will not experience complications related to bowel motility 06/07/2017 1225 - Progressing by Darrelyn Hillock, RN Will not experience complications related to urinary retention 06/07/2017 1225 - Progressing by Darrelyn Hillock, RN Pain Managment: General experience of comfort will improve 06/07/2017 1225 - Progressing by Darrelyn Hillock, RN Safety: Ability to remain free from injury will improve 06/07/2017 1225 - Progressing by Darrelyn Hillock, RN Skin Integrity: Risk for impaired skin integrity will decrease 06/07/2017 1225 - Progressing by Darrelyn Hillock, RN Education: Ability to identify signs and symptoms of gastrointestinal bleeding will  improve 06/07/2017 1225 - Progressing by Darrelyn Hillock, RN Bowel/Gastric: Will show no signs and symptoms of gastrointestinal bleeding 06/07/2017 1225 - Progressing by Darrelyn Hillock, RN Fluid Volume: Will show no signs and symptoms of excessive bleeding 06/07/2017 1225 - Progressing by Darrelyn Hillock, RN Clinical Measurements: Complications related to the disease process, condition or treatment will be avoided or minimized 06/07/2017 1225 - Progressing by Darrelyn Hillock, RN

## 2017-06-07 NOTE — Progress Notes (Signed)
Chaplain responded to an OR for pt who requested for prayer. [place] met pt in the presence of care providers who were in the Rm. Pt said she was feeling much better today han when she came to hospital. Pt was in a pleasant spirit and asked Singer to pray for her. CH offered prayer for pt and a ministry of presence.   06/07/17 1200  Clinical Encounter Type  Visited With Patient;Health care provider  Visit Type Initial;Follow-up;Spiritual support  Referral From Nurse  Consult/Referral To Chaplain  Spiritual Encounters  Spiritual Needs Prayer;Other (Comment)

## 2017-06-07 NOTE — Care Management Note (Signed)
Case Management Note  Patient Details  Name: CHRYSTIAN RESSLER MRN: [id number] Date of Birth: 1927/01/14  Subjective/Objective:                 Presents from home with breathing difficulty and found to have hemoglobin 5.  has received 2 units of blood and hemoglobin up to 8.2. Patient has a colon mass and family is considering pursing aggressive work up and surgery and medical clearance assessments are in progress. Discussed ambulating patient in the halls with staff to prevent decline in  functional status.  It is reported during progression patient is independent in her room.   Action/Plan:   Expected Discharge Date:                  Expected Discharge Plan:     In-House Referral:     Discharge planning Services     Post Acute Care Choice:    Choice offered to:     DME Arranged:    DME Agency:     HH Arranged:    HH Agency:     Status of Service:     If discussed at [employer] of Stay Meetings, dates discussed:    Additional Comments:  Katrina Stack, RN 06/07/2017, 4:44 PM

## 2017-06-07 NOTE — Evaluation (Signed)
Physical Therapy Evaluation Patient Details Name: [name] MRN: [id number] DOB: 07/16/1926 Today's Date: 06/07/2017   History of Present Illness  Pt is an 83 y.o. female presenting to hospital with generalized weakness and intermittent SOB x3 weeks; Hgb noted to be 5.0 in ED.  Pt admitted with symptomatic anemia (s/p 2 units PRBc's) d/t blood loss from colon tumor.  PMH includes asthma, htn, h/o colon CA, h/o R TKR (per chart appears to be Oct 2014 but pt reports Oct 2016).  Clinical Impression  Prior to hospital admission, pt was ambulatory with a walker.  Pt reports living alone in 1 level home with steps to enter.  Currently pt is mod assist supine to sit; mod to max assist to stand with RW; and pt unable to initiate any steps for ambulation with assist (d/t LE's buckling) using RW.  Pt also demonstrating impairments with sitting and standing balance.  Pt would benefit from skilled PT to address noted impairments and functional limitations (see below for any additional details).  Upon hospital discharge, recommend pt discharge to [facility].    Follow Up Recommendations SNF    Equipment Recommendations  Rolling walker with 5" wheels    Recommendations for Other Services OT consult     Precautions / Restrictions Precautions Precautions: Fall Restrictions Weight Bearing Restrictions: No      Mobility  Bed Mobility Overal bed mobility: Needs Assistance Bed Mobility: Supine to Sit     Supine to sit: Mod assist;HOB elevated     General bed mobility comments: assist for trunk supine to sit; increased effort and time to perform  Transfers Overall transfer level: Needs assistance Equipment used: Rolling walker (2 wheeled) Transfers: Sit to/from Stand Sit to Stand: Mod assist;Max assist         General transfer comment: x5 trials sit to/from stand from bed using RW; attempted multiple different cueing for UE and LE positioning and overall technique but pt requiring assist to  stand and maintain standing (LE's starting to buckling in standing limiting time standing); mod assist stand pivot bed to recliner (to L side)  Ambulation/Gait             General Gait Details: pt's LE's buckling; unable to maintain standing to attempt ambulation  Stairs            Wheelchair Mobility    Modified Rankin (Stroke Patients Only)       Balance Overall balance assessment: Needs assistance;History of Falls Sitting-balance support: Bilateral upper extremity supported;Feet supported Sitting balance-Leahy Scale: Poor Sitting balance - Comments: pt with intermittent posterior lean sitting edge of bed requiring min to mod assist to correct   Standing balance support: Bilateral upper extremity supported Standing balance-Leahy Scale: Poor Standing balance comment: pt requires UE support on RW to maintain balance; initial posterior lean noted in standing                             Pertinent Vitals/Pain Pain Assessment: No/denies pain  Vitals (HR and O2 on room air) stable and WFL throughout treatment session.    Home Living Family/patient expects to be discharged to:: Private residence Living Arrangements: Alone   Type of Home: House Home Access: Stairs to enter Entrance Stairs-Rails: None Entrance Stairs-Number of Steps: 2 Home Layout: One level Home Equipment: Grab bars - toilet;Grab bars - tub/shower      Prior Function Level of Independence: Independent with assistive device(s)  Comments: Pt reports being ambulatory with rollator; reports 1 fall in past 6 months.  Does not drive.     Hand Dominance        Extremity/Trunk Assessment   Upper Extremity Assessment Upper Extremity Assessment: Generalized weakness    Lower Extremity Assessment Lower Extremity Assessment: Generalized weakness       Communication   Communication: No difficulties  Cognition Arousal/Alertness: Awake/alert Behavior During Therapy: WFL for  tasks assessed/performed Overall Cognitive Status: (Oriented to person (not date/time))                                        General Comments General comments (skin integrity, edema, etc.): pt resting in bed upon PT entry.  Nursing cleared pt for participation in physical therapy.  Pt agreeable to PT session.    Exercises General Exercises - Lower Extremity Ankle Circles/Pumps: AROM;Strengthening;Both;10 reps;Supine Quad Sets: AROM;Strengthening;Both;10 reps;Supine Short Arc Quad: AROM;Strengthening;Both;10 reps;Supine Heel Slides: AROM;Strengthening;Both;10 reps;Supine Hip ABduction/ADduction: AROM;Strengthening;Both;10 reps;Supine Straight Leg Raises: AROM;Strengthening;Both;10 reps;Supine(quad lag noted with R LE SLR)   Assessment/Plan    PT Assessment Patient needs continued PT services  PT Problem List Decreased strength;Decreased activity tolerance;Decreased balance;Decreased mobility;Decreased knowledge of use of DME;Decreased knowledge of precautions       PT Treatment Interventions DME instruction;Gait training;Stair training;Functional mobility training;Therapeutic activities;Therapeutic exercise;Balance training;Patient/family education    PT Goals (Current goals can be found in the Care Plan section)  Acute Rehab PT Goals Patient Stated Goal: to get stronger PT Goal Formulation: With patient Time For Goal Achievement: 06/21/17 Potential to Achieve Goals: Good    Frequency Min 2X/week   Barriers to discharge Decreased caregiver support      Co-evaluation               AM-PAC PT "6 Clicks" Daily Activity  Outcome Measure Difficulty turning over in bed (including adjusting bedclothes, sheets and blankets)?: Unable Difficulty moving from lying on back to sitting on the side of the bed? : Unable Difficulty sitting down on and standing up from a chair with arms (e.g., wheelchair, bedside commode, etc,.)?: Unable Help needed moving to and  from a bed to chair (including a wheelchair)?: A Lot Help needed walking in hospital room?: Total Help needed climbing 3-5 steps with a railing? : Total 6 Click Score: 7    End of Session Equipment Utilized During Treatment: Gait belt Activity Tolerance: Patient limited by fatigue Patient left: in chair;with call bell/phone within reach;with chair alarm set Nurse Communication: Mobility status;Precautions PT Visit Diagnosis: Other abnormalities of gait and mobility (R26.89);Muscle weakness (generalized) (M62.81);History of falling (Z91.81)    Time: 1353-1430 PT Time Calculation (min) (ACUTE ONLY): 37 min   Charges:   PT Evaluation $PT Eval Low Complexity: 1 Low PT Treatments $Therapeutic Exercise: 8-22 mins $Therapeutic Activity: 8-22 mins   PT G Codes:   PT G-Codes **NOT FOR INPATIENT CLASS** Functional Assessment Tool Used: AM-PAC 6 Clicks Basic Mobility Functional Limitation: Mobility: Walking and moving around Mobility: Walking and Moving Around Current Status (D3267): At least 80 percent but less than 100 percent impaired, limited or restricted Mobility: Walking and Moving Around Goal Status [phone]): At least 1 percent but less than 20 percent impaired, limited or restricted    Leitha Bleak, PT 06/07/17, 4:50 PM [phone]

## 2017-06-07 NOTE — Progress Notes (Signed)
*  PRELIMINARY RESULTS* Echocardiogram 2D Echocardiogram has been performed.  [name] 06/07/2017, 9:31 PM

## 2017-06-07 NOTE — Progress Notes (Addendum)
Initial Nutrition Assessment  DOCUMENTATION CODES:   Not applicable  INTERVENTION:   Recommend check iron, B12, ferritin, and folate labs  Recommend check Mg and P as pt at refeeding risk   Ensure Enlive po BID, each supplement provides 350 kcal and 20 grams of protein  MVI daily  Dysphagia 3 diet as pt with poor dentition   Bowel regimen as needed   NUTRITION DIAGNOSIS:   Increased nutrient needs related to cancer and cancer related treatments(colon mass) as evidenced by moderate to severe muscle depletions over entire body, 8.5 percent weight loss in 5 months.  GOAL:   Patient will meet greater than or equal to 90% of their needs  MONITOR:   PO intake, Supplement acceptance, Labs, Weight trends, Skin, I & O's  REASON FOR ASSESSMENT:   Malnutrition Screening Tool    ASSESSMENT:   83 y.o. female with HTN, asthma, right colon mass diagnosed in 2013, refused to undergo surgical resection in the past, chronic B12 and iron deficiency anemia presents with worsening symptomatic anemia. This is secondary to blood loss from colon tumor.   Met with pt in room today. Pt is a poor historian but reports poor appetite and oral intake for 3 weeks. Per chart, pt has lost 11lbs(8.5%) in 5 months; this is significant given history. Pt with colon mass that she does not wish to have resected. Pt with history of anemia secondary to GI bleeds. Recommend check iron, B12, ferritin, folate labs as pt is at risk for deficiency. Recommend check Mg and P as pt at refeeding risk. Pt ate 60% of her lunch today. RD will order Ensure and MVI.   Medications reviewed and include: colace, marinol, protonix, NaCl _0 /hr  Labs reviewed: BUN 22(H), creat 1.29(H), alb 2.8(L), tbili 1.6(H) Hgb 8.2(L), Hct 28.1(L)  Nutrition-Focused physical exam completed. Findings are no fat depletion, moderate to severe muscle depletions over entire body, and no edema.   Diet Order:  Diet Heart Room service  appropriate? Yes; Fluid consistency: Thin  EDUCATION NEEDS:   Education needs have been addressed  Skin: Reviewed RN Assessment  Last BM:  11/26- type 2  Height:   Ht Readings from Last 1 Encounters:  06/06/17 _1  (1.575 m)    Weight:   Wt Readings from Last 1 Encounters:  06/07/17 119 lb 14.4 oz (54.4 kg)    Ideal Body Weight:  50 kg  BMI:  Body mass index is 21.93 kg/m.  Estimated Nutritional Needs:   Kcal:  1200-1400kcal/day   Protein:  65-76g/day   Fluid:  >1.2L/day   Koleen Distance MS, RD, LDN Pager #[phone] After Hours Pager: [phone]

## 2017-06-07 NOTE — Progress Notes (Signed)
[facility] NAME: [name]    MR#:  [id number]  DATE OF BIRTH:  1927-01-27  SUBJECTIVE:  CHIEF COMPLAINT: Patient is resting comfortably and getting her breakfast.  Denies any complaints.  Denies any blood in her stool or vomiting blood  REVIEW OF SYSTEMS:  CONSTITUTIONAL: No fever, fatigue or weakness.  EYES: No blurred or double vision.  EARS, NOSE, AND THROAT: No tinnitus or ear pain.  RESPIRATORY: No cough, shortness of breath, wheezing or hemoptysis.  CARDIOVASCULAR: No chest pain, orthopnea, edema.  GASTROINTESTINAL: No nausea, vomiting, diarrhea or abdominal pain.  GENITOURINARY: No dysuria, hematuria.  ENDOCRINE: No polyuria, nocturia,  HEMATOLOGY: No anemia, easy bruising or bleeding SKIN: No rash or lesion. MUSCULOSKELETAL: No joint pain or arthritis.   NEUROLOGIC: No tingling, numbness, weakness.  PSYCHIATRY: No anxiety or depression.   DRUG ALLERGIES:   Allergies  Allergen Reactions  . Aspirin Nausea And Vomiting  . Motrin [Ibuprofen] Nausea And Vomiting    VITALS:  Blood pressure 140/75, pulse 83, temperature 98.2 F (36.8 C), temperature source Oral, resp. rate 18, height 5\' 2"  (1.575 m), weight 54.4 kg (119 lb 14.4 oz), SpO2 97 %.  PHYSICAL EXAMINATION:  GENERAL:  83 y.o.-year-old patient lying in the bed with no acute distress.  EYES: Pupils equal, round, reactive to light and accommodation. No scleral icterus. Extraocular muscles intact.  HEENT: Head atraumatic, normocephalic. Oropharynx and nasopharynx clear.  NECK:  Supple, no jugular venous distention. No thyroid enlargement, no tenderness.  LUNGS: Normal breath sounds bilaterally, no wheezing, rales,rhonchi or crepitation. No use of accessory muscles of respiration.  CARDIOVASCULAR: S1, S2 normal. No murmurs, rubs, or gallops.  ABDOMEN: Soft, nontender, nondistended. Bowel sounds present. No organomegaly or mass.  EXTREMITIES: No pedal  edema, cyanosis, or clubbing.  NEUROLOGIC: Cranial nerves II through XII are intact. Muscle strength 5/5 in all extremities. Sensation intact. Gait not checked.  PSYCHIATRIC: The patient is alert and oriented x 3.  SKIN: No obvious rash, lesion, or ulcer.    LABORATORY PANEL:   CBC Recent Labs  Lab 06/07/17 0412  WBC 8.5  HGB 8.2*  HCT 28.1*  PLT 265   ------------------------------------------------------------------------------------------------------------------  Chemistries  Recent Labs  Lab 06/07/17 0412  NA 135  K 4.6  CL 107  CO2 22  GLUCOSE 81  BUN 22*  CREATININE 1.29*  CALCIUM 9.0  AST 43*  ALT 21  ALKPHOS 109  BILITOT 1.6*   ------------------------------------------------------------------------------------------------------------------  Cardiac Enzymes Recent Labs  Lab 06/07/17 0412  TROPONINI 0.13*   ------------------------------------------------------------------------------------------------------------------  RADIOLOGY:  Dg Chest Portable 1 View  Result Date: 06/06/2017 CLINICAL DATA:  83 year old female with poor appetite and weakness for the past 3 weeks. EXAM: PORTABLE CHEST 1 VIEW COMPARISON:  Chest x-ray 09/14/2014. FINDINGS: Lung volumes are normal. No consolidative airspace disease. No pleural effusions. No pneumothorax. No pulmonary nodule or mass noted. Pulmonary vasculature and the cardiomediastinal silhouette are within normal limits. Atherosclerosis in the thoracic aorta. IMPRESSION: 1.  No radiographic evidence of acute cardiopulmonary disease. 2. Aortic atherosclerosis. Electronically Signed   By: Vinnie Langton M.D.   On: 06/06/2017 14:20    EKG:   Orders placed or performed during the hospital encounter of 06/06/17  . EKG 12-Lead  . EKG 12-Lead    ASSESSMENT AND PLAN:    #Symptomatic anemia No active bleeding but stool for occult blood is positive Hemoglobin 7.7-after blood transfusion hemoglobin at 8.2 Patient was  seen by GI  not considering any endoscopy procedures and recommending a colon mass resection if clinically stable Appreciate GI recommendations  #colon mass Patient refused mass resection in the past, might consider the procedure at this time Will check with cardiology to see if the patient is a surgical candidate or not to get a colon mass resection  #Elevated troponin EKG with no ischemic changes Patient was seen and evaluated by [facility] cardiology Dr. Ubaldo Glassing recommending no invasive or noninvasive ischemic workup at this present patient is not a candidate Echo is ordered Candidate for heparin, aspirin or Plavix  # essential hypertension Continue metoprolol and titrate as needed  All the records are reviewed and case discussed with Care Management/Social Workerr. Management plans discussed with the patient, family and they are in agreement.  CODE STATUS: fc   TOTAL TIME TAKING CARE OF THIS PATIENT: 35  minutes.   POSSIBLE D/C IN 1-2  DAYS, DEPENDING ON CLINICAL CONDITION.  Note: This dictation was prepared with Dragon dictation along with smaller phrase technology. Any transcriptional errors that result from this process are unintentional.   Nicholes Mango M.D on 06/07/2017 at 10:32 AM  Between 7am to 6pm - Pager - [phone] After 6pm go to [email or web address] - password [email or web address] [facility]  [facility]  Office  [phone]  CC: Primary care physician; Glendon Axe, MD

## 2017-06-08 ENCOUNTER — Inpatient Hospital Stay: Payer: Medicare Other

## 2017-06-08 ENCOUNTER — Encounter: Payer: Self-pay | Admitting: Radiology

## 2017-06-08 DIAGNOSIS — C182 Malignant neoplasm of ascending colon: Secondary | ICD-10-CM

## 2017-06-08 DIAGNOSIS — R531 Weakness: Secondary | ICD-10-CM

## 2017-06-08 LAB — CBC
HEMATOCRIT: 28.8 % — AB (ref 35.0–47.0)
Hemoglobin: 8 g/dL — ABNORMAL LOW (ref 12.0–16.0)
MCH: 17.3 pg — AB (ref 26.0–34.0)
MCHC: 27.7 g/dL — AB (ref 32.0–36.0)
MCV: 62.6 fL — AB (ref 80.0–100.0)
PLATELETS: 237 10*3/uL (ref 150–440)
RBC: 4.6 MIL/uL (ref 3.80–5.20)
RDW: 38 % — AB (ref 11.5–14.5)
WBC: 9.4 10*3/uL (ref 3.6–11.0)

## 2017-06-08 LAB — PHOSPHORUS: Phosphorus: 2.4 mg/dL — ABNORMAL LOW (ref 2.5–4.6)

## 2017-06-08 MED ORDER — SODIUM CHLORIDE 0.9 % IV SOLN
100.0000 mg | Freq: Once | INTRAVENOUS | Status: AC
  Administered 2017-06-08: 100 mg via INTRAVENOUS
  Filled 2017-06-08: qty 5

## 2017-06-08 MED ORDER — IOPAMIDOL (ISOVUE-300) INJECTION 61%: 30.0000 mL | Freq: Once | INTRAVENOUS | Status: DC | PRN

## 2017-06-08 MED ORDER — K PHOS MONO-SOD PHOS DI & MONO 155-852-130 MG PO TABS
500.0000 mg | ORAL_TABLET | ORAL | Status: AC
  Administered 2017-06-08: 500 mg via ORAL
  Filled 2017-06-08 (×2): qty 2

## 2017-06-08 MED ORDER — IOPAMIDOL (ISOVUE-300) INJECTION 61%
75.0000 mL | Freq: Once | INTRAVENOUS | Status: AC | PRN
  Administered 2017-06-08: 75 mL via INTRAVENOUS

## 2017-06-08 MED ORDER — METOPROLOL TARTRATE 25 MG PO TABS: 25.0000 mg | ORAL_TABLET | Freq: Two times a day (BID) | ORAL | Status: DC

## 2017-06-08 NOTE — Consult Note (Signed)
SURGICAL CONSULTATION NOTE (initial) - cpt: 99255  HISTORY OF PRESENT ILLNESS (HPI):  83 y.o. female with known large ascending colon tumor diagnosed on colonoscopy in 2013, since which patient has refused surgery, presented to [facility] ED for evaluation of progressively worsening weakness, fatigue, unintentional weight loss, and decreased appetite. Workup was found to be significant for Hb of 5 and troponin of 0.14, for which patient received transfusion. Patient does not take iron as an outpatient. This past May, repeat CT ordered by oncologist Dr. Grayland Ormond demonstrated a new solitary liver metastasis, after which Dr. Bary Castilla was consulted and patient again refused surgery. During this admission, patient and her son now say they are willing to consider surgery, 5 years after colon tumor was diagnosed and 6 months following recognition of solitary liver metastasis. Patient otherwise reports she is able to eat and drink without difficulty except for a single episode of emesis 1 - 2 weeks ago with regular flatus and BM's. She denies any fever/chills, CP, or SOB, though required reorientation several times throughout discussion.  Surgery is consulted by medical physician Dr. Margaretmary Eddy in this context for evaluation and management of ascending colon cancer diagnosed 5 years ago with solitary liver metastasis as of CT imaging 6 months ago.  PAST MEDICAL HISTORY (PMH):  Past Medical History:  Diagnosis Date  . Asthma   . Depression   . Hypertension   . Memory deficit      PAST SURGICAL HISTORY ([place]):  Past Surgical History:  Procedure Laterality Date  . ABDOMINAL HYSTERECTOMY    . COLONOSCOPY  2013  . JOINT REPLACEMENT Right 04/2013   Dr Marry Guan  . UPPER GI ENDOSCOPY  2013     MEDICATIONS:  Prior to Admission medications   Medication Sig Start Date End Date Taking? Authorizing Provider  albuterol (PROVENTIL HFA;VENTOLIN HFA) 108 (90 Base) MCG/ACT inhaler Inhale 2 puffs into the lungs every 4 (four)  hours as needed.  10/21/16 10/21/17 Yes [provider]  pantoprazole (PROTONIX) 40 MG tablet Take 40 mg by mouth daily.  11/10/16  Yes [provider]  pravastatin (PRAVACHOL) 40 MG tablet Take 40 mg by mouth daily.   Yes [provider]  sertraline (ZOLOFT) 50 MG tablet Take 50 mg by mouth daily.   Yes [provider]  vitamin B-12 (CYANOCOBALAMIN) 500 MCG tablet Take 500 mcg by mouth daily.   Yes [provider]  traMADol (ULTRAM) 50 MG tablet Take 1 tablet (50 mg total) by mouth every 12 (twelve) hours as needed for moderate pain. Patient not taking: Reported on 05/12/2017 04/03/15   Sable Feil, PA-C     ALLERGIES:  Allergies  Allergen Reactions  . Aspirin Nausea And Vomiting  . Motrin [Ibuprofen] Nausea And Vomiting     SOCIAL HISTORY:  Social History   Socioeconomic History  . Marital status: Widowed    Spouse name: Not on file  . Number of children: Not on file  . Years of education: Not on file  . Highest education level: Not on file  Social Needs  . Financial resource strain: Not on file  . Food insecurity - worry: Not on file  . Food insecurity - inability: Not on file  . Transportation needs - medical: Not on file  . Transportation needs - non-medical: Not on file  Occupational History  . Not on file  Tobacco Use  . Smoking status: Never Smoker  . Smokeless tobacco: Never Used  Substance and Sexual Activity  . Alcohol use:  No  . Drug use: No  . Sexual activity: No  Other Topics Concern  . Not on file  Social History Narrative  . Not on file    The patient currently resides (home / rehab facility / nursing home): Home The patient normally is (ambulatory / bedbound): Ambulatory   FAMILY HISTORY:  Family History  Problem Relation Age of Onset  . Kidney disease Father   . Heart failure Mother   . Kidney disease Sister   . Bladder Cancer Neg Hx   . Kidney cancer Neg Hx      REVIEW OF SYSTEMS:   Constitutional: denies weight loss, fever, chills, or sweats  Eyes: denies any other vision changes, history of eye injury  ENT: denies sore throat, hearing problems  Respiratory: denies shortness of breath, wheezing  Cardiovascular: denies chest pain, palpitations  Gastrointestinal: abdominal pain, N/V, and bowel function as per HPI Genitourinary: denies burning with urination or urinary frequency Musculoskeletal: denies any other joint pains or cramps  Skin: denies any other rashes or skin discolorations  Neurological: weakness as per HPI, denies headache or dizziness Psychiatric: denies any other depression, anxiety   All other review of systems were negative   VITAL SIGNS:  Temp:  [97.3 F (36.3 C)-98.5 F (36.9 C)] 97.3 F (36.3 C) (11/27 0740) Pulse Rate:  [78-81] 81 (11/27 0740) Resp:  [18-19] 19 (11/27 0740) BP: (123-156)/(69-94) 156/82 (11/27 0740) SpO2:  [99 %-100 %] 100 % (11/27 0740) Weight:  [117 lb (53.1 kg)] 117 lb (53.1 kg) (11/27 0624)     Height: 5\' 2"  (157.5 cm) Weight: 117 lb (53.1 kg) BMI (Calculated): 21.39   INTAKE/OUTPUT:  This shift: Total I/O In: 3 [I.V.:3] Out: -   Last 2 shifts: @IOLAST2SHIFTS @   PHYSICAL EXAM:  Constitutional:  -- Normal body habitus  -- Awake, alert, and oriented x3, no apparent distress, though confused and required re-orientation several times during discussion Eyes:  -- Pupils equally round and reactive to light  -- No scleral icterus, B/L no occular discharge Ear, nose, throat: -- Neck is FROM WNL -- No jugular venous distension  Pulmonary:  -- No wheezes or rhales -- Equal breath sounds bilaterally -- Breathing non-labored at rest Cardiovascular:  -- S1, S2 present  -- No pericardial rubs  Gastrointestinal:  -- Abdomen completely soft, nontender, and non-distended with no guarding or rebound tenderness -- No abdominal masses appreciated, pulsatile or otherwise  Musculoskeletal and Integumentary:  -- Wounds or  skin discoloration: None appreciated -- Extremities: B/L UE and LE FROM, hands and feet warm, no edema  Neurologic:  -- Motor function: Intact and symmetric -- Sensation: Intact and symmetric Psychiatric:  -- Mood and affect WNL  Labs:  CBC Latest Ref Rng & Units 06/08/2017 06/07/2017 06/06/2017  WBC 3.6 - 11.0 K/uL 9.4 8.5 -  Hemoglobin 12.0 - 16.0 g/dL 8.0(L) 8.2(L) 7.7(L)  Hematocrit 35.0 - 47.0 % 28.8(L) 28.1(L) -  Platelets 150 - 440 K/uL 237 265 -   CMP Latest Ref Rng & Units 06/07/2017 06/06/2017 11/20/2016  Glucose 65 - 99 mg/dL 81 134(H) -  BUN 6 - 20 mg/dL 22(H) 26(H) -  Creatinine 0.44 - 1.00 mg/dL 1.29(H) 1.68(H) 1.30(H)  Sodium 135 - 145 mmol/L 135 135 -  Potassium 3.5 - 5.1 mmol/L 4.6 4.3 -  Chloride 101 - 111 mmol/L 107 105 -  CO2 22 - 32 mmol/L 22 21(L) -  Calcium 8.9 - 10.3 mg/dL 9.0 9.4 -  Total Protein 6.5 - 8.1  g/dL 6.8 7.5 -  Total Bilirubin 0.3 - 1.2 mg/dL 1.6(H) 0.7 -  Alkaline Phos 38 - 126 U/L 109 118 -  AST 15 - 41 U/L 43(H) 34 -  ALT 14 - 54 U/L 21 14 -   Imaging studies:  CT Abdomen and Pelvis with Contrast (11/20/2016) - personally reviewed with patient and her son bedside 5 cm annular constricting mass in the ascending colon, with extension into pericolonic fat, consistent with colon carcinoma.  New 1.8 cm hypovascular mass inferior right hepatic, highly suspicious for liver metastasis. No other sites metastatic disease Identified. Stable benign hemangioma in left hepatic lobe.  Severe diverticulosis involving descending and proximal  sigmoid colon. Nonobstructing left renal calculi.  Assessment/Plan: (ICD-10's: C18.9, C78.7) 83 y.o. female with symptomatic acute on chronic blood loss anemia attributable to a partially obstructing and fungating ascending colon mass (most likely carcinoma) first diagnosed in 2013 and diagnosed with likely solitary liver metastasis 6 months ago, for which she has repeatedly refused surgery, complicated by  pertinent comorbidities including hyperbilirubinemia, HTN, asthma, CKD, chronic malnutrition, dementia, and major depression disorder.   - clear liquids if anticipating surgery, though nutritional supplementation also indicated  - needs repeat staging CT chest, abdomen, and pelvis to assess for further metastases (will order)  - oncology reassessment for metastatic colon cancer with solitary liver metastasis (previously saw Dr. Grayland Ormond)   - if no further metastases and truly a surgical candidate for resection, would transfer to [facility], [facility], or [facility] for hemicolectomy with partial hepatectomy (metastectomy) for presumed colon malignancy with solitary liver metastasis  - continue to monitor Hb and transfuse prn  - medical management of comorbidities  - DVT prophylaxis  All of the above findings and recommendations were discussed at length with the patient, her son, and primary medical physician, and all of patient's and her family's questions were answered to their expressed satisfaction.  Thank you for the opportunity to participate in this patient's care.   -- Marilynne Drivers Rosana Hoes, MD, [place]: [facility] Partnering for exceptional care. Office: [phone]

## 2017-06-08 NOTE — Progress Notes (Signed)
[facility] [place] NAME: [name]    MR#:  [id number]  DATE OF BIRTH:  [birth year]  SUBJECTIVE:  CHIEF COMPLAINT: Patient is resting comfortably and getting her breakfast.  Son at bedside, considering colon mass resection after talking to surgery  REVIEW OF SYSTEMS:  CONSTITUTIONAL: No fever, fatigue or weakness.  EYES: No blurred or double vision.  EARS, NOSE, AND THROAT: No tinnitus or ear pain.  RESPIRATORY: No cough, shortness of breath, wheezing or hemoptysis.  CARDIOVASCULAR: No chest pain, orthopnea, edema.  GASTROINTESTINAL: No nausea, vomiting, diarrhea or abdominal pain.  GENITOURINARY: No dysuria, hematuria.  ENDOCRINE: No polyuria, nocturia,  HEMATOLOGY: No anemia, easy bruising or bleeding SKIN: No rash or lesion. MUSCULOSKELETAL: No joint pain or arthritis.   NEUROLOGIC: No tingling, numbness, weakness.  PSYCHIATRY: No anxiety or depression.   DRUG ALLERGIES:   Allergies  Allergen Reactions  . Aspirin Nausea And Vomiting  . Motrin [Ibuprofen] Nausea And Vomiting    VITALS:  Blood pressure (!) 156/82, pulse 81, temperature (!) 97.3 F (36.3 C), resp. rate 19, height 5\' 2"  (1.575 m), weight 53.1 kg (117 lb), SpO2 100 %.  PHYSICAL EXAMINATION:  GENERAL:  83 y.o.-year-old patient lying in the bed with no acute distress.  EYES: Pupils equal, round, reactive to light and accommodation. No scleral icterus. Extraocular muscles intact.  HEENT: Head atraumatic, normocephalic. Oropharynx and nasopharynx clear.  NECK:  Supple, no jugular venous distention. No thyroid enlargement, no tenderness.  LUNGS: Normal breath sounds bilaterally, no wheezing, rales,rhonchi or crepitation. No use of accessory muscles of respiration.  CARDIOVASCULAR: S1, S2 normal. No murmurs, rubs, or gallops.  ABDOMEN: Soft, nontender, nondistended. Bowel sounds present. No organomegaly or mass.  EXTREMITIES: No pedal edema, cyanosis, or  clubbing.  NEUROLOGIC: Cranial nerves II through XII are intact. Muscle strength 5/5 in all extremities. Sensation intact. Gait not checked.  PSYCHIATRIC: The patient is alert and oriented x 2-3.  SKIN: No obvious rash, lesion, or ulcer.    LABORATORY PANEL:   CBC Recent Labs  Lab 06/08/17 0510  WBC 9.4  HGB 8.0*  HCT 28.8*  PLT 237   ------------------------------------------------------------------------------------------------------------------  Chemistries  Recent Labs  Lab 06/07/17 0412 06/07/17 1743  NA 135  --   K 4.6  --   CL 107  --   CO2 22  --   GLUCOSE 81  --   BUN 22*  --   CREATININE 1.29*  --   CALCIUM 9.0  --   MG  --  1.9  AST 43*  --   ALT 21  --   ALKPHOS 109  --   BILITOT 1.6*  --    ------------------------------------------------------------------------------------------------------------------  Cardiac Enzymes Recent Labs  Lab 06/07/17 0940  TROPONINI 0.12*   ------------------------------------------------------------------------------------------------------------------  RADIOLOGY:  No results found.  EKG:   Orders placed or performed during the hospital encounter of 06/06/17  . EKG 12-Lead  . EKG 12-Lead    ASSESSMENT AND PLAN:    #Symptomatic anemia No active bleeding but stool for occult blood is positive Hemoglobin 7.7-after blood transfusion hemoglobin at 8.2-8.0, monitor hemoglobin closely Iron is low IV iron transfusion today Patient was seen by GI not considering any endoscopy procedures and recommending a colon mass resection if clinically stable Appreciate GI recommendations Palliative care is involved  #colon mass Patient refused mass resection in the past, might consider the procedure at this time Discussed with cardiology, patient at low to moderate risk  for noncardiac surgery, medically optimized Consult placed to surgery, discussed with Dr. Rosana Hoes.  Patient's son want to talk to surgery face-to-face before  making final decisions  #Elevated troponin EKG with no ischemic changes Patient was seen and evaluated by [facility] cardiology Dr. Ubaldo Glassing recommending no invasive or noninvasive ischemic workup at this present patient is not a candidate Echo is ordered Not a Candidate for heparin, aspirin or Plavix Beta-blocker is added to the regimen  # essential hypertension Continue metoprolol and titrate as needed  All the records are reviewed and case discussed with Care Management/Social Workerr. Management plans discussed with the patient, son Mr. Chrissie Noa [phone] and they are in agreement.  CODE STATUS: fc   TOTAL TIME TAKING CARE OF THIS PATIENT: 35  minutes.   POSSIBLE D/C IN 1-2  DAYS, DEPENDING ON CLINICAL CONDITION.  Note: This dictation was prepared with Dragon dictation along with smaller phrase technology. Any transcriptional errors that result from this process are unintentional.   Nicholes Mango M.D on 06/08/2017 at 3:07 PM  Between 7am to 6pm - Pager - [phone] After 6pm go to [email or web address] - password [email or web address] [facility] [place]  [facility]  Office  [phone]  CC: Primary care physician; Glendon Axe, MD

## 2017-06-08 NOTE — Progress Notes (Addendum)
Patient ID: [name], female   DOB: 07/28/1926, 83 y.o.   MRN: [id number]  This NP visited patient's at the bedside as a follow up to  yesterday's [facility]; son Yvone Neu at bedside.  Continued conversation with patient and son regarding diagnosis, prognosis, viable treatment options, and values and goals of care important to the patient and family.  Patient had little insight into her current medical situation.  We discussed several topics as it relates to the patient's impending decision regarding surgery for her known colon mass.  I expressed my concern to the patient's known cognitive decline, and current frailty.  I discussed the difference between an aggressive medical intervention path of the palliative comfort path for this patient at this time in this situation.  Discussed the possibility of hospice benefit.  Discussed with patient and her son the importance of continued conversation  their  medical providers regarding overall plan of care and treatment options,  ensuring decisions are within the context of the patients values and GOCs.  Strongly encouraged to consider DNR/DNI status knowing poor outcomes in similar patients.  Patient's son is hopeful to speak to surgery today in order to help make decisions regarding surgery moving forward.  Questions and concerns addressed   discussed with Dr. Margaretmary Eddy  Time in    1330       Time out    1430 total time spent on the unit was 60 minutes  Greater than 50% of the time was spent in counseling and coordination of care  Wadie Lessen NP  Palliative Medicine Team Team Phone # [phone] Pager [phone]

## 2017-06-08 NOTE — Progress Notes (Signed)
[facility]  SUBJECTIVE: improved   Vitals:   06/07/17 0820 06/07/17 1929 06/08/17 0624 06/08/17 0740  BP: 140/75 123/69 (!) 147/94 (!) 156/82  Pulse: 83 78 80 81  Resp: 18 18 18 19   Temp: 98.2 F (36.8 C) 98.5 F (36.9 C) 98 F (36.7 C) (!) 97.3 F (36.3 C)  TempSrc: Oral Oral Oral   SpO2: 97% 99% 99% 100%  Weight:   53.1 kg (117 lb)   Height:        Intake/Output Summary (Last 24 hours) at 06/08/2017 1313 Last data filed at 06/08/2017 1005 Gross per 24 hour  Intake 363 ml  Output 1 ml  Net 362 ml    LABS: Basic Metabolic Panel: Recent Labs    06/06/17 1205 06/07/17 0412 06/07/17 1743 06/08/17 0510  NA 135 135  --   --   K 4.3 4.6  --   --   CL 105 107  --   --   CO2 21* 22  --   --   GLUCOSE 134* 81  --   --   BUN 26* 22*  --   --   CREATININE 1.68* 1.29*  --   --   CALCIUM 9.4 9.0  --   --   MG  --   --  1.9  --   PHOS  --   --   --  2.4*   Liver Function Tests: Recent Labs    06/06/17 1205 06/07/17 0412  AST 34 43*  ALT 14 21  ALKPHOS 118 109  BILITOT 0.7 1.6*  PROT 7.5 6.8  ALBUMIN 3.3* 2.8*   No results for input(s): LIPASE, AMYLASE in the last 72 hours. CBC: Recent Labs    06/07/17 0412 06/08/17 0510  WBC 8.5 9.4  HGB 8.2* 8.0*  HCT 28.1* 28.8*  MCV 62.1* 62.6*  PLT 265 237   Cardiac Enzymes: Recent Labs    06/06/17 2358 06/07/17 0412 06/07/17 0940  TROPONINI 0.14* 0.13* 0.12*   BNP: Invalid input(s): POCBNP D-Dimer: No results for input(s): DDIMER in the last 72 hours. Hemoglobin A1C: No results for input(s): HGBA1C in the last 72 hours. Fasting Lipid Panel: No results for input(s): CHOL, HDL, LDLCALC, TRIG, CHOLHDL, LDLDIRECT in the last 72 hours. Thyroid Function Tests: No results for input(s): TSH, T4TOTAL, T3FREE, THYROIDAB in the last 72 hours.  Invalid input(s): FREET3 Anemia Panel: Recent Labs    06/07/17 1743  VITAMINB12 1,047*  FOLATE 10.6  FERRITIN  13  TIBC 412  IRON 16*     Physical Exam: Blood pressure (!) 156/82, pulse 81, temperature (!) 97.3 F (36.3 C), resp. rate 19, height 5\' 2"  (1.575 m), weight 53.1 kg (117 lb), SpO2 100 %.   Wt Readings from Last 1 Encounters:  06/08/17 53.1 kg (117 lb)     General appearance: alert and cooperative Resp: clear to auscultation bilaterally Cardio: regular rate and rhythm GI: soft, non-tender; bowel sounds normal; no masses,  no organomegaly Extremities: extremities normal, atraumatic, no cyanosis or edema Neurologic: Grossly normal  TELEMETRY: Reviewed telemetry pt in nsr:  ASSESSMENT AND PLAN:  Principal Problem:   Symptomatic anemia-likely secondary to gi blood loss due to colonic mass. OK to proceed with resection of colonic mass. Pt is at low-medium risk from cardiac standpoint and is optimized from cardiac standpoint. Will add metoprolol 25 bid.      Teodoro Spray, MD, [facility] 06/08/2017 1:13 PM

## 2017-06-09 DIAGNOSIS — R1907 Generalized intra-abdominal and pelvic swelling, mass and lump: Secondary | ICD-10-CM

## 2017-06-09 DIAGNOSIS — Z79899 Other long term (current) drug therapy: Secondary | ICD-10-CM

## 2017-06-09 DIAGNOSIS — J45909 Unspecified asthma, uncomplicated: Secondary | ICD-10-CM

## 2017-06-09 DIAGNOSIS — I10 Essential (primary) hypertension: Secondary | ICD-10-CM

## 2017-06-09 DIAGNOSIS — K922 Gastrointestinal hemorrhage, unspecified: Secondary | ICD-10-CM

## 2017-06-09 DIAGNOSIS — C801 Malignant (primary) neoplasm, unspecified: Secondary | ICD-10-CM

## 2017-06-09 DIAGNOSIS — Z66 Do not resuscitate: Secondary | ICD-10-CM

## 2017-06-09 DIAGNOSIS — R410 Disorientation, unspecified: Secondary | ICD-10-CM

## 2017-06-09 DIAGNOSIS — C787 Secondary malignant neoplasm of liver and intrahepatic bile duct: Secondary | ICD-10-CM

## 2017-06-09 NOTE — Progress Notes (Signed)
Patient ID: [name], female   DOB: 01-May-1927, 83 y.o.   MRN: [id number]  This NP visited patient's at the bedside as a follow up for palliative needs and emotional support; son Yvone Neu at bedside.  Continued conversation with patient and son regarding diagnosis, prognosis, viable treatment options, and values and goals of care important to the patient and family.  Various specialist have meet with patient and son today and it is now known that oncology and surgery have no viable treatment options to offer. Recommendation is for a comfort approach.   Patient has little insight into her current medical situation, and is intermittently confused today as to time and place.  I discussed the difference between an aggressive medical intervention path vs a palliative comfort path for this patient at this time in this situation.  Detailed hospice benefit.    At this time son is leaning toward an attempt at [facility] for rehabilitation understanding that patient will likely need long term care and hospice support.  Discussed with patient and her son the importance of continued conversation  their  medical providers regarding overall plan of care and treatment options,  ensuring decisions are within the context of the patients values and GOCs.  Questions and concerns addressed   Discussed with Dr. Leslye Peer  Time in  1400          Time out 1445     Total time spent on the unit was 45 minutes  Greater than 50% of the time was spent in counseling and coordination of care  Wadie Lessen NP  Palliative Medicine Team Team Phone # [phone] Pager [phone]

## 2017-06-09 NOTE — Clinical Social Work Note (Signed)
Clinical Social Work Assessment  Patient Details  Name: [name] MRN: [id number] Date of Birth: 1926-09-15  Date of referral:  06/09/17               Reason for consult:  Facility Placement                Permission sought to share information with:  Facility Sport and exercise psychologist, Family Supports Permission granted to share information::  Yes, Verbal Permission Granted  Name::     [name] [phone] or [name] [phone]  [phone]   Agency::  SNF admissions  Relationship::     Contact Information:     Housing/Transportation Living arrangements for the past 2 months:  Single Family Home Source of Information:  Patient, Adult Children Patient Interpreter Needed:  None Criminal Activity/Legal Involvement Pertinent to Current Situation/Hospitalization:  No - Comment as needed Significant Relationships:  Adult Children Lives with:  Self Do you feel safe going back to the place where you live?  No Need for family participation in patient care:  Yes (Comment)  Care giving concerns: Patient and her family feel that she needs some short term rehab before she is able to return back home.   Social Worker assessment / plan:  Patient is an 83 year old female who is alert and oriented x4. Patient states she has been to rehab before, but she could not remember where.  CSW spoke to patient's son who confirmed patient was at rehab about 5 years ago, however he could not remember the name of the facility either.  CSW explained how insurance will pay for stay at SNF and what to expect once she goes to rehab along with the role of CSW trying to find placement.  CSW was given permission to begin bed search in [place].  Patient and her son did not have any other questions or concerns.                                                                                                                                                                                                                         Employment status:  Retired Forensic scientist:  Medicare PT Recommendations:  [place] / Referral to community resources:  [place]  Patient/Family's Response to care:  Patient and family are agreeable to going to SNF for short term rehab.  Patient/Family's Understanding of and Emotional Response to Diagnosis, Current Treatment, and Prognosis:  Patient and  son are hopeful that she will not have to be at facility for very long.  Emotional Assessment Appearance:  Appears stated age Attitude/Demeanor/Rapport:    Affect (typically observed):  Appropriate, Calm, Stable Orientation:  Oriented to Self, Oriented to Place, Oriented to  Time, Oriented to Situation Alcohol / Substance use:  Not Applicable Psych involvement (Current and /or in the community):  No (Comment)  Discharge Needs  Concerns to be addressed:  Lack of Support Readmission within the last 30 days:  No Current discharge risk:  Lives alone Barriers to Discharge:  Continued Medical Work up   [name] 06/09/2017, 4:16 PM

## 2017-06-09 NOTE — NC FL2 (Signed)
[place] LEVEL OF CARE SCREENING TOOL     IDENTIFICATION  Patient Name: [name] Birthdate: 12/08/1926 Sex: female Admission Date (Current Location): 06/06/2017  [place] and [place] Number:  Engineering geologist and Address:  [facility], [address] [place], [place] [address]      Provider Number: [id number]  Attending Physician Name and Address:  Loletha Grayer, MD  Relative Name and Phone Number:  [name] [phone] or [name] [phone]  [phone]     Current Level of Care: Hospital Recommended Level of Care: [place] Prior Approval Number:    Date Approved/Denied:   PASRR Number: [id number]  Discharge Plan: SNF    Current Diagnoses: Patient Active Problem List   Diagnosis Date Noted  . Weakness   . Acute blood loss anemia   . Palliative care encounter   . Goals of care, counseling/discussion   . DNR (do not resuscitate) discussion   . GI bleed 06/06/2017  . Symptomatic anemia 06/06/2017  . Elevated troponin 06/06/2017  . Colonic mass 11/03/2016  . Candida UTI 03/24/2016  . Imbalance 12/13/2015  . Reactive airway disease without complication 70/26/3785  . Depression, major, single episode, complete remission ([place]) 10/01/2015  . Essential hypertension 10/01/2015  . Gastroesophageal reflux disease without esophagitis 06/10/2015  . Primary osteoarthritis of left shoulder 06/10/2015  . Seasonal allergic rhinitis 06/10/2015  . Chronic anemia 12/01/2014  . Mild intermittent reactive airway disease without complication 88/50/2774  . Vitamin B 12 deficiency 12/01/2014  . Depressive disorder 11/29/2014  . Other allergic rhinitis 11/29/2014  . H/O iron deficiency anemia 06/20/2014  . Benign essential HTN 04/11/2014  . Chronic kidney disease (CKD), stage III (moderate) ([place]) 04/11/2014  . Mixed hyperlipidemia 04/11/2014  . Valvular heart disease 04/11/2014  . Right  hip pain 01/17/2014  . Osteoarthritis of right hip 01/02/2014  . Fall 10/14/2012  . H/O arthroscopy of right knee 10/14/2012  . Hyperlipidemia 10/14/2012  . Hypertension 10/14/2012  . Osteoarthritis of right knee 10/14/2012    Orientation RESPIRATION BLADDER Height & Weight     Self, Time, Situation, Place  Normal Incontinent Weight: 128 lb (58.1 kg) Height:  5\' 2"  (157.5 cm)  BEHAVIORAL SYMPTOMS/MOOD NEUROLOGICAL BOWEL NUTRITION STATUS      Incontinent Diet(Cardiac)  AMBULATORY STATUS COMMUNICATION OF NEEDS Skin   Limited Assist Verbally Normal                       Personal Care Assistance Level of Assistance  Bathing, Feeding, Dressing Bathing Assistance: Limited assistance Feeding assistance: Independent Dressing Assistance: Limited assistance     Functional Limitations Info  Sight, Hearing, Speech Sight Info: Adequate Hearing Info: Adequate Speech Info: Adequate    SPECIAL CARE FACTORS FREQUENCY  PT (By licensed PT)     PT Frequency: 5x a week              Contractures Contractures Info: Not present    Additional Factors Info  Code Status, Allergies, Psychotropic Code Status Info: DNR Allergies Info: ASPIRIN, MOTRIN IBUPROFEN  Psychotropic Info: sertraline (ZOLOFT) tablet 50 mg          Current Medications (06/09/2017):  This is the current hospital active medication list Current Facility-Administered Medications  Medication Dose Route Frequency Provider Last Rate Last Dose  . acetaminophen (TYLENOL) tablet 650 mg  650 mg Oral Q6H PRN Idelle Crouch, MD       Or  . acetaminophen (TYLENOL) suppository  650 mg  650 mg Rectal Q6H PRN Idelle Crouch, MD      . albuterol (PROVENTIL) (2.5 MG/3ML) 0.083% nebulizer solution 2.5 mg  2.5 mg Inhalation Q4H PRN Idelle Crouch, MD      . bisacodyl (DULCOLAX) suppository 10 mg  10 mg Rectal Daily PRN Idelle Crouch, MD      . docusate sodium (COLACE) capsule 100 mg  100 mg Oral BID Idelle Crouch, MD   100 mg at 06/09/17 1053  . dronabinol (MARINOL) capsule 2.5 mg  2.5 mg Oral BID AC Dellinger, Marianne L, PA-C   2.5 mg at 06/09/17 1236  . feeding supplement (ENSURE ENLIVE) (ENSURE ENLIVE) liquid 237 mL  237 mL Oral BID BM Gouru, Aruna, MD   237 mL at 06/09/17 1433  . iopamidol (ISOVUE-300) 61 % injection 30 mL  30 mL Oral Once PRN Vickie Epley, MD      . metoprolol tartrate (LOPRESSOR) tablet 25 mg  25 mg Oral BID Idelle Crouch, MD   25 mg at 06/09/17 1048  . multivitamin with minerals tablet 1 tablet  1 tablet Oral Daily Gouru, Aruna, MD   1 tablet at 06/09/17 1048  . ondansetron (ZOFRAN) tablet 4 mg  4 mg Oral Q6H PRN Idelle Crouch, MD       Or  . ondansetron (ZOFRAN) injection 4 mg  4 mg Intravenous Q6H PRN Idelle Crouch, MD      . pantoprazole (PROTONIX) injection 40 mg  40 mg Intravenous Q12H Idelle Crouch, MD   40 mg at 06/09/17 1048  . pravastatin (PRAVACHOL) tablet 40 mg  40 mg Oral Daily Idelle Crouch, MD   40 mg at 06/09/17 1049  . sertraline (ZOLOFT) tablet 50 mg  50 mg Oral Daily Idelle Crouch, MD   50 mg at 06/09/17 1049  . sodium chloride flush (NS) 0.9 % injection 3 mL  3 mL Intravenous Q12H Gouru, Aruna, MD   3 mL at 06/09/17 1051     Discharge Medications: Please see discharge summary for a list of discharge medications.  Relevant Imaging Results:  Relevant Lab Results:   Additional Information SSN [id number]  Ross Ludwig, [place]

## 2017-06-09 NOTE — Progress Notes (Signed)
Patient ID: [name], female   DOB: 1926/10/27, 83 y.o.   MRN: [id number]  [facility] PROGRESS NOTE  [name]:[id number] DOB: 06-Jan-1927 DOA: 06/06/2017 PCP: Glendon Axe, MD  HPI/Subjective: Patient feels okay.  Never had any pain.  Objective: Vitals:   06/09/17 0758 06/09/17 1429  BP: (!) 156/82 (!) 110/53  Pulse: 75 71  Resp: 18   Temp: 98.2 F (36.8 C)   SpO2: 97% 96%    Filed Weights   06/07/17 0401 06/08/17 0624 06/09/17 0514  Weight: 54.4 kg (119 lb 14.4 oz) 53.1 kg (117 lb) 58.1 kg (128 lb)    ROS: Review of Systems  Constitutional: Negative for chills and fever.  Eyes: Negative for blurred vision.  Respiratory: Negative for cough and shortness of breath.   Cardiovascular: Negative for chest pain.  Gastrointestinal: Negative for abdominal pain, constipation, diarrhea, nausea and vomiting.  Genitourinary: Negative for dysuria.  Musculoskeletal: Negative for joint pain.  Neurological: Negative for dizziness and headaches.   Exam: Physical Exam  HENT:  Nose: No mucosal edema.  Mouth/Throat: No oropharyngeal exudate or posterior oropharyngeal edema.  Eyes: Conjunctivae, EOM and lids are normal. Pupils are equal, round, and reactive to light.  Neck: No JVD present. Carotid bruit is not present. No edema present. No thyroid mass and no thyromegaly present.  Cardiovascular: S1 normal and S2 normal. Exam reveals no gallop.  No murmur heard. Pulses:      Dorsalis pedis pulses are 2+ on the right side, and 2+ on the left side.  Respiratory: No respiratory distress. She has no wheezes. She has no rhonchi. She has no rales.  GI: Soft. Bowel sounds are normal. There is no tenderness.  Musculoskeletal:       Right ankle: She exhibits no swelling.       Left ankle: She exhibits no swelling.  Lymphadenopathy:    She has no cervical adenopathy.  Neurological: She is alert. No cranial nerve deficit.  Skin: Skin is warm. No rash noted. Nails show no  clubbing.  Psychiatric: She has a normal mood and affect.      Data Reviewed: Basic Metabolic Panel: Recent Labs  Lab 06/06/17 1205 06/07/17 0412 06/07/17 1743 06/08/17 0510  NA 135 135  --   --   K 4.3 4.6  --   --   CL 105 107  --   --   CO2 21* 22  --   --   GLUCOSE 134* 81  --   --   BUN 26* 22*  --   --   CREATININE 1.68* 1.29*  --   --   CALCIUM 9.4 9.0  --   --   MG  --   --  1.9  --   PHOS  --   --   --  2.4*   Liver Function Tests: Recent Labs  Lab 06/06/17 1205 06/07/17 0412  AST 34 43*  ALT 14 21  ALKPHOS 118 109  BILITOT 0.7 1.6*  PROT 7.5 6.8  ALBUMIN 3.3* 2.8*   CBC: Recent Labs  Lab 06/06/17 1205 06/06/17 2358 06/07/17 0412 06/08/17 0510  WBC 7.7  --  8.5 9.4  HGB 5.0* 7.7* 8.2* 8.0*  HCT 19.2*  --  28.1* 28.8*  MCV 50.8*  --  62.1* 62.6*  PLT 337  --  265 237   Cardiac Enzymes: Recent Labs  Lab 06/06/17 1205 06/06/17 2358 06/07/17 0412 06/07/17 0940  TROPONINI 0.14* 0.14* 0.13* 0.12*  Studies: Ct Chest W Contrast  Result Date: 06/09/2017 CLINICAL DATA:  Ascending colon tumor, colon cancer restaging. EXAM: CT CHEST, ABDOMEN, AND PELVIS WITH CONTRAST TECHNIQUE: Multidetector CT imaging of the chest, abdomen and pelvis was performed following the standard protocol during bolus administration of intravenous contrast. CONTRAST:  58mL ISOVUE-300 IOPAMIDOL (ISOVUE-300) INJECTION 61% COMPARISON:  COMPARISON Multiple exams, including CT abdomen 11/20/2016 FINDINGS: CT CHEST FINDINGS Cardiovascular: Coronary, aortic arch, and branch vessel atherosclerotic vascular disease. Mild cardiomegaly. Mediastinum/Nodes: No pathologic adenopathy is observed in the chest. Lungs/Pleura: Small right and trace left pleural effusions with associated passive atelectasis. Mild dependent atelectasis in the left upper lobe. Musculoskeletal: Severe degenerative glenohumeral arthropathy bilaterally. Thoracic kyphosis with age-indeterminate compression fractures at T3  and T4 (likely late subacute given vertebral sclerosis) and chronic appearing inferior endplate compression fracture at T8. CT ABDOMEN PELVIS FINDINGS Hepatobiliary: Multiple new and enlarging hepatic metastatic lesions are observed. The segment 6 lesion which previously measured 1.8 by 1.4 cm currently measures 7.3 by 4.9 cm on image 60/3. Numerous new masses are present including a 4.1 by 3.5 cm mass in the lateral segment left hepatic lobe on image 53/3. No biliary dilatation. There is a hemangioma in segment 2 of the liver. Pancreas: Stable borderline dorsal pancreatic duct dilatation, otherwise unremarkable. Spleen: Unremarkable Adrenals/Urinary Tract: Stable mild nodularity of the right adrenal gland without overt mass. Cortical atrophy in both kidneys. Right kidney lower pole cyst appears stable. Bilateral nonobstructive nephrolithiasis including a 1.6 cm left collecting system calculus an adjacent 0.6 cm left collecting system calculus as well as small bilateral punctate renal calculi in the bilateral renal upper poles. No hydronephrosis or hydroureter, or appreciable ureteral calculus. Stomach/Bowel: 6.5 cm in length circumferential ascending colon mass measuring up to 2.3 cm in single wall thickness with surrounding stranding and possible invasion into and beyond the adventitia, with adjacent tumor nodularity along the right paracolic gutter as suggested on image 73/3. Descending colon diverticulosis. Vascular/Lymphatic: Aortoiliac atherosclerotic vascular disease. No significant degree of adenopathy. Reproductive: Uterus absent.  Ovaries normal. Other: No ascites or definite omental involvement by tumor. Musculoskeletal: Moderate degenerative arthropathy of both hips. Mild levoconvex lumbar scoliosis. Lumbar degenerative disc disease with lower lumbar spondylosis. Grade 1 degenerative retrolisthesis at L1-2 and grade 1 degenerative anterolisthesis at L4-5. IMPRESSION: 1. Large ascending colon  circumferential mass with local invasion of adjacent soft tissues. Multiple new and enlarging masses in the liver representing considerable new metastatic involvement. No findings of metastatic disease to the lungs, visualized skeleton, retroperitoneal/porta hepatis lymph nodes. 2. Other imaging findings of potential clinical significance: Athero coronary atherosclerosis with mild cardiomegaly. Small right and trace left pleural effusions are new. T3 and T4 compression fractures are likely late subacute, there is a chronic appearing T8 compression fracture and these compression fractures indicate osteoporosis. Hemangioma in segment 2 of the liver. Bilateral nonobstructive nephrolithiasis with a large 1.6 cm left collecting system calculus. Descending colon diverticulosis. Considerable glenohumeral and degenerative hip arthropathy. Lumbar spondylosis, degenerative disc disease, and scoliosis. Electronically Signed   By: Van Clines M.D.   On: 06/09/2017 10:11   Ct Abdomen Pelvis W Contrast  Result Date: 06/09/2017 CLINICAL DATA:  Ascending colon tumor, colon cancer restaging. EXAM: CT CHEST, ABDOMEN, AND PELVIS WITH CONTRAST TECHNIQUE: Multidetector CT imaging of the chest, abdomen and pelvis was performed following the standard protocol during bolus administration of intravenous contrast. CONTRAST:  49mL ISOVUE-300 IOPAMIDOL (ISOVUE-300) INJECTION 61% COMPARISON:  COMPARISON Multiple exams, including CT abdomen 11/20/2016 FINDINGS: CT CHEST FINDINGS Cardiovascular:  Coronary, aortic arch, and branch vessel atherosclerotic vascular disease. Mild cardiomegaly. Mediastinum/Nodes: No pathologic adenopathy is observed in the chest. Lungs/Pleura: Small right and trace left pleural effusions with associated passive atelectasis. Mild dependent atelectasis in the left upper lobe. Musculoskeletal: Severe degenerative glenohumeral arthropathy bilaterally. Thoracic kyphosis with age-indeterminate compression  fractures at T3 and T4 (likely late subacute given vertebral sclerosis) and chronic appearing inferior endplate compression fracture at T8. CT ABDOMEN PELVIS FINDINGS Hepatobiliary: Multiple new and enlarging hepatic metastatic lesions are observed. The segment 6 lesion which previously measured 1.8 by 1.4 cm currently measures 7.3 by 4.9 cm on image 60/3. Numerous new masses are present including a 4.1 by 3.5 cm mass in the lateral segment left hepatic lobe on image 53/3. No biliary dilatation. There is a hemangioma in segment 2 of the liver. Pancreas: Stable borderline dorsal pancreatic duct dilatation, otherwise unremarkable. Spleen: Unremarkable Adrenals/Urinary Tract: Stable mild nodularity of the right adrenal gland without overt mass. Cortical atrophy in both kidneys. Right kidney lower pole cyst appears stable. Bilateral nonobstructive nephrolithiasis including a 1.6 cm left collecting system calculus an adjacent 0.6 cm left collecting system calculus as well as small bilateral punctate renal calculi in the bilateral renal upper poles. No hydronephrosis or hydroureter, or appreciable ureteral calculus. Stomach/Bowel: 6.5 cm in length circumferential ascending colon mass measuring up to 2.3 cm in single wall thickness with surrounding stranding and possible invasion into and beyond the adventitia, with adjacent tumor nodularity along the right paracolic gutter as suggested on image 73/3. Descending colon diverticulosis. Vascular/Lymphatic: Aortoiliac atherosclerotic vascular disease. No significant degree of adenopathy. Reproductive: Uterus absent.  Ovaries normal. Other: No ascites or definite omental involvement by tumor. Musculoskeletal: Moderate degenerative arthropathy of both hips. Mild levoconvex lumbar scoliosis. Lumbar degenerative disc disease with lower lumbar spondylosis. Grade 1 degenerative retrolisthesis at L1-2 and grade 1 degenerative anterolisthesis at L4-5. IMPRESSION: 1. Large ascending  colon circumferential mass with local invasion of adjacent soft tissues. Multiple new and enlarging masses in the liver representing considerable new metastatic involvement. No findings of metastatic disease to the lungs, visualized skeleton, retroperitoneal/porta hepatis lymph nodes. 2. Other imaging findings of potential clinical significance: Athero coronary atherosclerosis with mild cardiomegaly. Small right and trace left pleural effusions are new. T3 and T4 compression fractures are likely late subacute, there is a chronic appearing T8 compression fracture and these compression fractures indicate osteoporosis. Hemangioma in segment 2 of the liver. Bilateral nonobstructive nephrolithiasis with a large 1.6 cm left collecting system calculus. Descending colon diverticulosis. Considerable glenohumeral and degenerative hip arthropathy. Lumbar spondylosis, degenerative disc disease, and scoliosis. Electronically Signed   By: Van Clines M.D.   On: 06/09/2017 10:11    Scheduled Meds: . docusate sodium  100 mg Oral BID  . dronabinol  2.5 mg Oral BID AC  . feeding supplement (ENSURE ENLIVE)  237 mL Oral BID BM  . metoprolol tartrate  25 mg Oral BID  . multivitamin with minerals  1 tablet Oral Daily  . pantoprazole (PROTONIX) IV  40 mg Intravenous Q12H  . pravastatin  40 mg Oral Daily  . sertraline  50 mg Oral Daily  . sodium chloride flush  3 mL Intravenous Q12H    Assessment/Plan:  1. Metastatic colon cancer to the liver.  Patient is not a surgical candidate at this time.  Patient is not a chemotherapy candidate at this time.  Patient seen by palliative care.  Likely will go out to rehab with palliative care following with conversion to hospice  if patient declines. 2. Symptomatic anemia.  Check hemoglobin tomorrow morning.  Give another IV iron tomorrow morning.   3. Elevated troponin demand ischemia from Symptomatic anemia 4. Depression on Zoloft    Code Status:     Code Status Orders   (From admission, onward)        Start     Ordered   06/09/17 1417  Do not attempt resuscitation (DNR)  Continuous    Question Answer Comment  In the event of cardiac or respiratory ARREST Do not call a "code blue"   In the event of cardiac or respiratory ARREST Do not perform Intubation, CPR, defibrillation or ACLS   In the event of cardiac or respiratory ARREST Use medication by any route, position, wound care, and other measures to relive pain and suffering. May use oxygen, suction and manual treatment of airway obstruction as needed for comfort.   Comments nurse may pronounce      06/09/17 1416    Code Status History    Date Active Date Inactive Code Status Order ID Comments User Context   06/06/2017 15:43 06/09/2017 14:16 Full Code [id number]  Idelle Crouch, MD Inpatient     Family Communication: Spoke with son on the phone to set up family meeting Disposition Plan: Potentially out to rehab tomorrow  Consultants:  General surgery  Oncology  Palliative care  Time spent: 20 minutes  [place]

## 2017-06-09 NOTE — Progress Notes (Signed)
Patient ID: [name], female   DOB: [birth year], 83 y.o.   MRN: [id number]  ACP discussion  Patient and son at the bedside along with palliative care team  Diagnosis: Metastatic colon cancer to liver.  The patient was initially diagnosed with a villous adenoma back in 2013.  Had another CT scan in 2015 that showed a colon mass.  Now with metastatic lesions to liver.  Patient is not a surgical candidate.  Patient is not a chemotherapy candidate secondary to deconditioning and poor functional status.  Patient made a DO NOT RESUSCITATE.  Since the patient lives home alone and likely will not be able to do this.  Decision to go to rehab to try to get stronger with palliative care following.  If the patient declines, she can be transferred to hospice home.  The goal is now comfort measures.  Dr. Loletha Grayer  Time spent on ACP discussion 30 minutes

## 2017-06-09 NOTE — Care Management (Signed)
Patient's colon mass present since 2013 and the liver metastasis was noted six months ago. CT of abdomen and chest have been ordered  to evaluate for further mets. If found to be a surgical candidate, it is recommend patient transfer to [facility] or [facility].  Attending will speak with patient's son about plan of care.  Palliative is following

## 2017-06-09 NOTE — Consult Note (Signed)
[name]  [phone] [phone]) [phone]  ID: [name] OB: [birth year]  MR#: [id number]  LFY#:[id number]  Patient Care Team: Glendon Axe, MD as PCP - General (Internal Medicine) McKenzie, Candee Furbish, MD as Consulting Physician (Urology) Christene Lye, MD (General Surgery) Bary Castilla, Forest Gleason, MD (General Surgery) Marval Regal, NP as Nurse Practitioner (Nurse Practitioner) Lequita Asal, MD as Referring Physician (Hematology and Oncology)  CHIEF COMPLAINT: Colon mass with multiple liver metastasis.  INTERVAL HISTORY: Patient is an 83 year old female who was evaluated once by oncology in May 2018.  At that point she had a known colon mass for several years and it was recommended to pursue surgery.  She never followed up in the cancer center and recently presented to the emergency room with significant anemia.  CT scan revealed heavy tumor burden of metastatic disease in her liver.  Patient appears confused at times and much of the history is given by her son.  He states her performance status has been declining over the past several months and her memory is becoming worse.  She has no neurologic complaints.  There is no report of any recent fevers.  She has no chest pain or shortness of breath.  She has no nausea, vomiting, constipation, or diarrhea.  She has significant melena.  She has no urinary complaints.  Patient and her son offer no further specific complaints.  REVIEW OF SYSTEMS:   Review of Systems  Constitutional: Positive for malaise/fatigue and weight loss. Negative for fever.  Respiratory: Negative.  Negative for cough and shortness of breath.   Cardiovascular: Negative.  Negative for chest pain and leg swelling.  Gastrointestinal: Positive for blood in stool and melena. Negative for abdominal pain.  Genitourinary: Negative.   Musculoskeletal: Negative.   Skin: Negative.  Negative for rash.  Neurological: Positive for  weakness.  Psychiatric/Behavioral: Positive for memory loss.    As per HPI. Otherwise, a complete review of systems is negative.  PAST MEDICAL HISTORY: Past Medical History:  Diagnosis Date  . Asthma   . Depression   . Hypertension   . Memory deficit     PAST SURGICAL HISTORY: Past Surgical History:  Procedure Laterality Date  . ABDOMINAL HYSTERECTOMY    . COLONOSCOPY  2013  . JOINT REPLACEMENT Right 04/2013   Dr Marry Guan  . UPPER GI ENDOSCOPY  2013    FAMILY HISTORY: Family History  Problem Relation Age of Onset  . Kidney disease Father   . Heart failure Mother   . Kidney disease Sister   . Bladder Cancer Neg Hx   . Kidney cancer Neg Hx     ADVANCED DIRECTIVES (Y/N):  @ADVDIR @  HEALTH MAINTENANCE: Social History   Tobacco Use  . Smoking status: Never Smoker  . Smokeless tobacco: Never Used  Substance Use Topics  . Alcohol use: No  . Drug use: No     Colonoscopy:  PAP:  Bone density:  Lipid panel:  Allergies  Allergen Reactions  . Aspirin Nausea And Vomiting  . Motrin [Ibuprofen] Nausea And Vomiting    Current Facility-Administered Medications  Medication Dose Route Frequency Provider Last Rate Last Dose  . acetaminophen (TYLENOL) tablet 650 mg  650 mg Oral Q6H PRN Idelle Crouch, MD       Or  . acetaminophen (TYLENOL) suppository 650 mg  650 mg Rectal Q6H PRN Idelle Crouch, MD      . albuterol (PROVENTIL) (2.5 MG/3ML) 0.083% nebulizer solution 2.5 mg  2.5  mg Inhalation Q4H PRN Idelle Crouch, MD      . bisacodyl (DULCOLAX) suppository 10 mg  10 mg Rectal Daily PRN Idelle Crouch, MD      . docusate sodium (COLACE) capsule 100 mg  100 mg Oral BID Idelle Crouch, MD   100 mg at 06/09/17 1053  . dronabinol (MARINOL) capsule 2.5 mg  2.5 mg Oral BID AC Dellinger, Marianne L, PA-C   2.5 mg at 06/09/17 1236  . feeding supplement (ENSURE ENLIVE) (ENSURE ENLIVE) liquid 237 mL  237 mL Oral BID BM Gouru, Aruna, MD   237 mL at 06/09/17 1050  .  iopamidol (ISOVUE-300) 61 % injection 30 mL  30 mL Oral Once PRN Vickie Epley, MD      . metoprolol tartrate (LOPRESSOR) tablet 25 mg  25 mg Oral BID Idelle Crouch, MD   25 mg at 06/09/17 1048  . multivitamin with minerals tablet 1 tablet  1 tablet Oral Daily Gouru, Aruna, MD   1 tablet at 06/09/17 1048  . ondansetron (ZOFRAN) tablet 4 mg  4 mg Oral Q6H PRN Idelle Crouch, MD       Or  . ondansetron (ZOFRAN) injection 4 mg  4 mg Intravenous Q6H PRN Idelle Crouch, MD      . pantoprazole (PROTONIX) injection 40 mg  40 mg Intravenous Q12H Idelle Crouch, MD   40 mg at 06/09/17 1048  . pravastatin (PRAVACHOL) tablet 40 mg  40 mg Oral Daily Idelle Crouch, MD   40 mg at 06/09/17 1049  . sertraline (ZOLOFT) tablet 50 mg  50 mg Oral Daily Idelle Crouch, MD   50 mg at 06/09/17 1049  . sodium chloride flush (NS) 0.9 % injection 3 mL  3 mL Intravenous Q12H Gouru, Aruna, MD   3 mL at 06/09/17 1051    OBJECTIVE: Vitals:   06/09/17 0514 06/09/17 0758  BP: (!) 152/80 (!) 156/82  Pulse: 73 75  Resp: 18 18  Temp: 98 F (36.7 C) 98.2 F (36.8 C)  SpO2: 97% 97%     Body mass index is 23.41 kg/m.    ECOG FS:0 - Asymptomatic  General: Thin, no acute distress. Eyes: Pink conjunctiva, anicteric sclera. HEENT: Normocephalic, moist mucous membranes, clear oropharnyx. Lungs: Clear to auscultation bilaterally. Heart: Regular rate and rhythm. No rubs, murmurs, or gallops. Abdomen: Soft, nontender, nondistended. No organomegaly noted, normoactive bowel sounds. Musculoskeletal: No edema, cyanosis, or clubbing. Neuro: Alert, answering all questions appropriately. Cranial nerves grossly intact. Skin: No rashes or petechiae noted. Psych: Normal affect.   LAB RESULTS:  Lab Results  Component Value Date   NA 135 06/07/2017   K 4.6 06/07/2017   CL 107 06/07/2017   CO2 22 06/07/2017   GLUCOSE 81 06/07/2017   BUN 22 (H) 06/07/2017   CREATININE 1.29 (H) 06/07/2017   CALCIUM 9.0  06/07/2017   PROT 6.8 06/07/2017   ALBUMIN 2.8 (L) 06/07/2017   AST 43 (H) 06/07/2017   ALT 21 06/07/2017   ALKPHOS 109 06/07/2017   BILITOT 1.6 (H) 06/07/2017   GFRNONAA 36 (L) 06/07/2017   GFRAA 42 (L) 06/07/2017    Lab Results  Component Value Date   WBC 9.4 06/08/2017   NEUTROABS 4.1 11/16/2016   HGB 8.0 (L) 06/08/2017   HCT 28.8 (L) 06/08/2017   MCV 62.6 (L) 06/08/2017   PLT 237 06/08/2017     STUDIES: Ct Chest W Contrast  Result Date: 06/09/2017 CLINICAL DATA:  Ascending colon tumor, colon cancer restaging. EXAM: CT CHEST, ABDOMEN, AND PELVIS WITH CONTRAST TECHNIQUE: Multidetector CT imaging of the chest, abdomen and pelvis was performed following the standard protocol during bolus administration of intravenous contrast. CONTRAST:  42mL ISOVUE-300 IOPAMIDOL (ISOVUE-300) INJECTION 61% COMPARISON:  COMPARISON Multiple exams, including CT abdomen 11/20/2016 FINDINGS: CT CHEST FINDINGS Cardiovascular: Coronary, aortic arch, and branch vessel atherosclerotic vascular disease. Mild cardiomegaly. Mediastinum/Nodes: No pathologic adenopathy is observed in the chest. Lungs/Pleura: Small right and trace left pleural effusions with associated passive atelectasis. Mild dependent atelectasis in the left upper lobe. Musculoskeletal: Severe degenerative glenohumeral arthropathy bilaterally. Thoracic kyphosis with age-indeterminate compression fractures at T3 and T4 (likely late subacute given vertebral sclerosis) and chronic appearing inferior endplate compression fracture at T8. CT ABDOMEN PELVIS FINDINGS Hepatobiliary: Multiple new and enlarging hepatic metastatic lesions are observed. The segment 6 lesion which previously measured 1.8 by 1.4 cm currently measures 7.3 by 4.9 cm on image 60/3. Numerous new masses are present including a 4.1 by 3.5 cm mass in the lateral segment left hepatic lobe on image 53/3. No biliary dilatation. There is a hemangioma in segment 2 of the liver. Pancreas: Stable  borderline dorsal pancreatic duct dilatation, otherwise unremarkable. Spleen: Unremarkable Adrenals/Urinary Tract: Stable mild nodularity of the right adrenal gland without overt mass. Cortical atrophy in both kidneys. Right kidney lower pole cyst appears stable. Bilateral nonobstructive nephrolithiasis including a 1.6 cm left collecting system calculus an adjacent 0.6 cm left collecting system calculus as well as small bilateral punctate renal calculi in the bilateral renal upper poles. No hydronephrosis or hydroureter, or appreciable ureteral calculus. Stomach/Bowel: 6.5 cm in length circumferential ascending colon mass measuring up to 2.3 cm in single wall thickness with surrounding stranding and possible invasion into and beyond the adventitia, with adjacent tumor nodularity along the right paracolic gutter as suggested on image 73/3. Descending colon diverticulosis. Vascular/Lymphatic: Aortoiliac atherosclerotic vascular disease. No significant degree of adenopathy. Reproductive: Uterus absent.  Ovaries normal. Other: No ascites or definite omental involvement by tumor. Musculoskeletal: Moderate degenerative arthropathy of both hips. Mild levoconvex lumbar scoliosis. Lumbar degenerative disc disease with lower lumbar spondylosis. Grade 1 degenerative retrolisthesis at L1-2 and grade 1 degenerative anterolisthesis at L4-5. IMPRESSION: 1. Large ascending colon circumferential mass with local invasion of adjacent soft tissues. Multiple new and enlarging masses in the liver representing considerable new metastatic involvement. No findings of metastatic disease to the lungs, visualized skeleton, retroperitoneal/porta hepatis lymph nodes. 2. Other imaging findings of potential clinical significance: Athero coronary atherosclerosis with mild cardiomegaly. Small right and trace left pleural effusions are new. T3 and T4 compression fractures are likely late subacute, there is a chronic appearing T8 compression fracture  and these compression fractures indicate osteoporosis. Hemangioma in segment 2 of the liver. Bilateral nonobstructive nephrolithiasis with a large 1.6 cm left collecting system calculus. Descending colon diverticulosis. Considerable glenohumeral and degenerative hip arthropathy. Lumbar spondylosis, degenerative disc disease, and scoliosis. Electronically Signed   By: Van Clines M.D.   On: 06/09/2017 10:11   Ct Abdomen Pelvis W Contrast  Result Date: 06/09/2017 CLINICAL DATA:  Ascending colon tumor, colon cancer restaging. EXAM: CT CHEST, ABDOMEN, AND PELVIS WITH CONTRAST TECHNIQUE: Multidetector CT imaging of the chest, abdomen and pelvis was performed following the standard protocol during bolus administration of intravenous contrast. CONTRAST:  72mL ISOVUE-300 IOPAMIDOL (ISOVUE-300) INJECTION 61% COMPARISON:  COMPARISON Multiple exams, including CT abdomen 11/20/2016 FINDINGS: CT CHEST FINDINGS Cardiovascular: Coronary, aortic arch, and branch vessel atherosclerotic vascular disease. Mild cardiomegaly. Mediastinum/Nodes:  No pathologic adenopathy is observed in the chest. Lungs/Pleura: Small right and trace left pleural effusions with associated passive atelectasis. Mild dependent atelectasis in the left upper lobe. Musculoskeletal: Severe degenerative glenohumeral arthropathy bilaterally. Thoracic kyphosis with age-indeterminate compression fractures at T3 and T4 (likely late subacute given vertebral sclerosis) and chronic appearing inferior endplate compression fracture at T8. CT ABDOMEN PELVIS FINDINGS Hepatobiliary: Multiple new and enlarging hepatic metastatic lesions are observed. The segment 6 lesion which previously measured 1.8 by 1.4 cm currently measures 7.3 by 4.9 cm on image 60/3. Numerous new masses are present including a 4.1 by 3.5 cm mass in the lateral segment left hepatic lobe on image 53/3. No biliary dilatation. There is a hemangioma in segment 2 of the liver. Pancreas: Stable  borderline dorsal pancreatic duct dilatation, otherwise unremarkable. Spleen: Unremarkable Adrenals/Urinary Tract: Stable mild nodularity of the right adrenal gland without overt mass. Cortical atrophy in both kidneys. Right kidney lower pole cyst appears stable. Bilateral nonobstructive nephrolithiasis including a 1.6 cm left collecting system calculus an adjacent 0.6 cm left collecting system calculus as well as small bilateral punctate renal calculi in the bilateral renal upper poles. No hydronephrosis or hydroureter, or appreciable ureteral calculus. Stomach/Bowel: 6.5 cm in length circumferential ascending colon mass measuring up to 2.3 cm in single wall thickness with surrounding stranding and possible invasion into and beyond the adventitia, with adjacent tumor nodularity along the right paracolic gutter as suggested on image 73/3. Descending colon diverticulosis. Vascular/Lymphatic: Aortoiliac atherosclerotic vascular disease. No significant degree of adenopathy. Reproductive: Uterus absent.  Ovaries normal. Other: No ascites or definite omental involvement by tumor. Musculoskeletal: Moderate degenerative arthropathy of both hips. Mild levoconvex lumbar scoliosis. Lumbar degenerative disc disease with lower lumbar spondylosis. Grade 1 degenerative retrolisthesis at L1-2 and grade 1 degenerative anterolisthesis at L4-5. IMPRESSION: 1. Large ascending colon circumferential mass with local invasion of adjacent soft tissues. Multiple new and enlarging masses in the liver representing considerable new metastatic involvement. No findings of metastatic disease to the lungs, visualized skeleton, retroperitoneal/porta hepatis lymph nodes. 2. Other imaging findings of potential clinical significance: Athero coronary atherosclerosis with mild cardiomegaly. Small right and trace left pleural effusions are new. T3 and T4 compression fractures are likely late subacute, there is a chronic appearing T8 compression fracture  and these compression fractures indicate osteoporosis. Hemangioma in segment 2 of the liver. Bilateral nonobstructive nephrolithiasis with a large 1.6 cm left collecting system calculus. Descending colon diverticulosis. Considerable glenohumeral and degenerative hip arthropathy. Lumbar spondylosis, degenerative disc disease, and scoliosis. Electronically Signed   By: Van Clines M.D.   On: 06/09/2017 10:11   Dg Chest Portable 1 View  Result Date: 06/06/2017 CLINICAL DATA:  83 year old female with poor appetite and weakness for the past 3 weeks. EXAM: PORTABLE CHEST 1 VIEW COMPARISON:  Chest x-ray 09/14/2014. FINDINGS: Lung volumes are normal. No consolidative airspace disease. No pleural effusions. No pneumothorax. No pulmonary nodule or mass noted. Pulmonary vasculature and the cardiomediastinal silhouette are within normal limits. Atherosclerosis in the thoracic aorta. IMPRESSION: 1.  No radiographic evidence of acute cardiopulmonary disease. 2. Aortic atherosclerosis. Electronically Signed   By: Vinnie Langton M.D.   On: 06/06/2017 14:20    ASSESSMENT: Colon mass with multiple liver metastasis.  PLAN:    1. Colon mass with multiple liver metastasis: Patient known to have a mass in her colon for several years, but elected not to pursue any treatment options.  She now has a significant amount of metastatic disease in her liver.  Given her advanced age and declining performance status, no chemotherapy is recommended.  Case discussed with both palliative care and surgery.  Have recommended comfort care and hospice to the son and the patient, but they have not yet made a decision.  No further interventions are needed from an oncology standpoint. 2.  Anemia: Secondary to GI bleed and malignancy.  Improved with transfusion.  Continue to monitor and transfuse as needed.  Appreciate consult, call with questions.    Lloyd Huger, MD   06/09/2017 2:14 PM

## 2017-06-09 NOTE — Care Management Important Message (Signed)
Important Message  Patient Details  Name: [name] MRN: [id number] Date of Birth: 1926-12-23   Medicare Important Message Given:  Yes Signed IM notice given    Katrina Stack, RN 06/09/2017, 5:02 PM

## 2017-06-09 NOTE — Care Management (Signed)
There will not be a discharge to a tertiary facility for surgery.  Anticipate discharge to a skilled nursing facility within next 24 hours.

## 2017-06-09 NOTE — Plan of Care (Signed)
No bleeding/stools noted this shift.

## 2017-06-09 NOTE — Progress Notes (Signed)
SURGICAL PROGRESS NOTE (cpt [phone])  Hospital Day(s): 3.   Post op day(s):  [name]   Interval History: Patient seen and examined, no acute events or new complaints overnight. Patient continues to report she feels well with +flatus and +BM, denies abdominal pain, N/V, fever/chills, CP, or SOB.  Review of Systems:  Constitutional: denies fever, chills  HEENT: denies cough or congestion  Respiratory: denies any shortness of breath  Cardiovascular: denies chest pain or palpitations  Gastrointestinal: abdominal pain, N/V, and bowel function as per interval history Genitourinary: denies burning with urination or urinary frequency Musculoskeletal: denies pain, decreased motor or sensation Integumentary: denies any other rashes or skin discolorations Neurological: denies HA or vision/hearing changes   Vital signs in last 24 hours: [min-max] current  Temp:  [97.3 F (36.3 C)-98.6 F (37 C)] 98 F (36.7 C) (11/28 0514) Pulse Rate:  [73-81] 73 (11/28 0514) Resp:  [18-19] 18 (11/28 0514) BP: (142-156)/(77-82) 152/80 (11/28 0514) SpO2:  [97 %-100 %] 97 % (11/28 0514) Weight:  [128 lb (58.1 kg)] 128 lb (58.1 kg) (11/28 0514)     Height: 5\' 2"  (157.5 cm) Weight: 128 lb (58.1 kg) BMI (Calculated): 23.41   Intake/Output this shift:  No intake/output data recorded.   Intake/Output last 2 shifts:  @IOLAST2SHIFTS @   Physical Exam:  Constitutional: alert, cooperative and no distress  HENT: normocephalic without obvious abnormality  Eyes: PERRL, EOM's grossly intact and symmetric  Neuro: CN II - XII grossly intact and symmetric without deficit  Respiratory: breathing non-labored at rest  Cardiovascular: regular rate and sinus rhythm  Gastrointestinal: soft, non-tender, and non-distended Musculoskeletal: UE and LE FROM, no edema or wounds, motor and sensation grossly intact, NT   Labs:  CBC Latest Ref Rng & Units 06/08/2017 06/07/2017 06/06/2017  WBC 3.6 - 11.0 K/uL 9.4 8.5 -  Hemoglobin 12.0  - 16.0 g/dL 8.0(L) 8.2(L) 7.7(L)  Hematocrit 35.0 - 47.0 % 28.8(L) 28.1(L) -  Platelets 150 - 440 K/uL 237 265 -   CMP Latest Ref Rng & Units 06/07/2017 06/06/2017 11/20/2016  Glucose 65 - 99 mg/dL 81 134(H) -  BUN 6 - 20 mg/dL 22(H) 26(H) -  Creatinine 0.44 - 1.00 mg/dL 1.29(H) 1.68(H) 1.30(H)  Sodium 135 - 145 mmol/L 135 135 -  Potassium 3.5 - 5.1 mmol/L 4.6 4.3 -  Chloride 101 - 111 mmol/L 107 105 -  CO2 22 - 32 mmol/L 22 21(L) -  Calcium 8.9 - 10.3 mg/dL 9.0 9.4 -  Total Protein 6.5 - 8.1 g/dL 6.8 7.5 -  Total Bilirubin 0.3 - 1.2 mg/dL 1.6(H) 0.7 -  Alkaline Phos 38 - 126 U/L 109 118 -  AST 15 - 41 U/L 43(H) 34 -  ALT 14 - 54 U/L 21 14 -   CEA (11/16/2016): 9.0  Imaging studies:  CT Chest, Abdomen, and Pelvis with Contrast (06/09/2017) - personally reviewed and discussed with patient 1. Large ascending colon circumferential mass with local invasion  of adjacent soft tissues. Multiple new and enlarging masses in the liver representing considerable new metastatic involvement. No findings of metastatic disease to the lungs, visualized skeleton, retroperitoneal/porta hepatis lymph nodes.  2. Other imaging findings of potential clinical significance: Athero coronary atherosclerosis with mild cardiomegaly. Small right and trace left pleural effusions are new. T3 and T4 compression fractures are likely late subacute, there is a chronic appearing T8 compression fracture and these compression fractures indicate osteoporosis. Hemangioma in segment 2 of the liver. Bilateral nonobstructive nephrolithiasis with a large 1.6 cm left  collecting system calculus. Descending colon diverticulosis. Considerable glenohumeral and degenerative hip arthropathy. Lumbar spondylosis, degenerative disc disease, and scoliosis.   Assessment/Plan: (ICD-10's: C18.9, C78.7) 83 y.o. female with symptomatic acute on chronic blood loss anemia attributable to a partially obstructing and fungating ascending  colon mass (most likely carcinoma) first diagnosed in 2013 and diagnosed with likely solitary liver metastasis 6 months ago, for which she has repeatedly refused surgery, now complicated by very extensive liver metastases and by pertinent comorbidities including hyperbilirubinemia, HTN, asthma, CKD, chronic malnutrition, dementia, and major depression disorder.              - regular diet  - no indication for surgical intervention at this time             - agree with palliative/hospice care directed towards improving patient's comfort             - continue to monitor Hb and transfuse prn, iron supplementation             - medical management of comorbidities             - DVT prophylaxis  All of the above findings and recommendations were discussed at length with the patient, oncologist, and primary medical physician, and all of patient's and her family's questions were answered to their expressed satisfaction.  Thank you for the opportunity to participate in this patient's care.   -- Marilynne Drivers Rosana Hoes, MD, McK[place]: [facility] - Partnering for exceptional care. Office: [phone]

## 2017-06-10 DIAGNOSIS — Z66 Do not resuscitate: Secondary | ICD-10-CM

## 2017-06-10 LAB — PHOSPHORUS: Phosphorus: 2.5 mg/dL (ref 2.5–4.6)

## 2017-06-10 LAB — PREPARE RBC (CROSSMATCH)

## 2017-06-10 LAB — HEMOGLOBIN: Hemoglobin: 7.8 g/dL — ABNORMAL LOW (ref 12.0–16.0)

## 2017-06-10 LAB — MAGNESIUM: MAGNESIUM: 1.9 mg/dL (ref 1.7–2.4)

## 2017-06-10 MED ORDER — LACTULOSE 10 GM/15ML PO SOLN
30.0000 g | Freq: Every day | ORAL | Status: DC | PRN
Start: 2017-06-10 — End: 2017-06-10

## 2017-06-10 MED ORDER — ACETAMINOPHEN 325 MG PO TABS: 650.0000 mg | ORAL_TABLET | Freq: Four times a day (QID) | ORAL | 0 refills | Status: AC | PRN

## 2017-06-10 MED ORDER — SODIUM CHLORIDE 0.9 % IV SOLN
Freq: Once | INTRAVENOUS | Status: AC
  Administered 2017-06-10: 13:00:00 via INTRAVENOUS

## 2017-06-10 MED ORDER — FERROUS SULFATE 325 (65 FE) MG PO TABS: 325.0000 mg | ORAL_TABLET | Freq: Two times a day (BID) | ORAL | 0 refills | Status: AC

## 2017-06-10 MED ORDER — SODIUM CHLORIDE 0.9 % IV SOLN
400.0000 mg | Freq: Once | INTRAVENOUS | Status: AC
  Administered 2017-06-10: 400 mg via INTRAVENOUS
  Filled 2017-06-10: qty 20

## 2017-06-10 MED ORDER — OXYCODONE HCL 20 MG/ML PO CONC: 5.0000 mg | ORAL | Status: DC | PRN

## 2017-06-10 MED ORDER — ENSURE ENLIVE PO LIQD
237.0000 mL | Freq: Two times a day (BID) | ORAL | 0 refills | Status: AC
Start: 2017-06-10 — End: ?

## 2017-06-10 MED ORDER — LACTULOSE 10 GM/15ML PO SOLN: 30.0000 g | Freq: Every day | ORAL | 0 refills | Status: AC | PRN

## 2017-06-10 MED ORDER — DOCUSATE SODIUM 100 MG PO CAPS: 100.0000 mg | ORAL_CAPSULE | Freq: Two times a day (BID) | ORAL | 0 refills | Status: AC

## 2017-06-10 MED ORDER — FERROUS SULFATE 325 (65 FE) MG PO TABS
325.0000 mg | ORAL_TABLET | Freq: Two times a day (BID) | ORAL | Status: DC
  Administered 2017-06-10: 325 mg via ORAL
  Filled 2017-06-10: qty 1

## 2017-06-10 MED ORDER — FUROSEMIDE 10 MG/ML IJ SOLN
40.0000 mg | Freq: Once | INTRAMUSCULAR | Status: AC
  Administered 2017-06-10: 40 mg via INTRAVENOUS
  Filled 2017-06-10: qty 4

## 2017-06-10 MED ORDER — METOPROLOL TARTRATE 25 MG PO TABS: 25.0000 mg | ORAL_TABLET | Freq: Two times a day (BID) | ORAL | 0 refills | Status: AC

## 2017-06-10 MED ORDER — MORPHINE SULFATE (CONCENTRATE) 10 MG/0.5ML PO SOLN: 5.0000 mg | ORAL | Status: DC | PRN

## 2017-06-10 MED ORDER — MORPHINE SULFATE (CONCENTRATE) 10 MG/0.5ML PO SOLN: 5.0000 mg | ORAL | 0 refills | Status: AC | PRN

## 2017-06-10 MED ORDER — ONDANSETRON HCL 4 MG PO TABS: 4.0000 mg | ORAL_TABLET | Freq: Four times a day (QID) | ORAL | 0 refills | Status: AC | PRN

## 2017-06-10 MED ORDER — ACETAMINOPHEN 325 MG PO TABS
650.0000 mg | ORAL_TABLET | Freq: Once | ORAL | Status: AC
  Administered 2017-06-10: 650 mg via ORAL
  Filled 2017-06-10: qty 2

## 2017-06-10 NOTE — Progress Notes (Addendum)
Clinical Social Worker (CSW) presented bed offers to patient and she chose [facility]. Patient's son Yvone Neu is agreeable for patient to go to [facility] today. Patient is medically stable for D/C to [facility] today. Per [facility] liaison patient can come today to room 706. RN will call report to 700 hall at [phone] and arrange EMS for transport. CSW sent D/C orders to [facility] via [facility]. Patient will have palliative to follow, CSW left [facility] liaison a voicemail making her aware of above. Patient is aware of above. Patient's son Yvone Neu is aware of above. Please reconsult if future social work needs arise. CSW signing off.   [employer], LCSW [phone]

## 2017-06-10 NOTE — H&P (Signed)
Report called to [facility], patient finishing up unit of PRBCs.

## 2017-06-10 NOTE — Progress Notes (Signed)
New referral for out patient Palliative to follow at [facility] received from [place]. Plan is for discharge today. Patient information faxed to referral. Flo Shanks RN, BSN, [place] [facility] liaison [phone] c

## 2017-06-10 NOTE — Plan of Care (Signed)
No stools or signs of bleed noted this shift.  No voiced complaints of pain.

## 2017-06-10 NOTE — Clinical Social Work Placement (Signed)
   CLINICAL SOCIAL WORK PLACEMENT  NOTE  Date:  06/10/2017  Patient Details  Name: [name] MRN: [id number] Date of Birth: [birth year]  Clinical Social Work is seeking post-discharge placement for this patient at the [place] level of care (*CSW will initial, date and re-position this form in  chart as items are completed):  Yes   Patient/family provided with [place] Work Department's list of facilities offering this level of care within the geographic area requested by the patient (or if unable, by the patient's family).  Yes   Patient/family informed of their freedom to choose among providers that offer the needed level of care, that participate in Medicare, Medicaid or managed care program needed by the patient, have an available bed and are willing to accept the patient.  Yes   Patient/family informed of [facility]'s ownership interest in [facility] and [facility], as well as of the fact that they are under no obligation to receive care at these facilities.  PASRR submitted to EDS on       PASRR number received on       Existing PASRR number confirmed on 06/09/17     FL2 transmitted to all facilities in geographic area requested by pt/family on 06/09/17     FL2 transmitted to all facilities within larger geographic area on       Patient informed that his/her managed care company has contracts with or will negotiate with certain facilities, including the following:        Yes   Patient/family informed of bed offers received.  Patient chooses bed at ([facility] )     Physician recommends and patient chooses bed at      Patient to be transferred to ([facility] ) on 06/10/17.  Patient to be transferred to facility by [facility] )     Patient family notified on 06/10/17 of transfer.  Name of family member notified:  (Patinet's son Yvone Neu is aware of D/C today. )     PHYSICIAN       Additional Comment:     _______________________________________________ Shavawn Stobaugh, Veronia Beets, LCSW 06/10/2017, 9:00 AM

## 2017-06-10 NOTE — Discharge Summary (Signed)
[facility] [place] NAME: [name]    MR#:  [id number]  DATE OF BIRTH:  1927-04-25  DATE OF ADMISSION:  06/06/2017 ADMITTING PHYSICIAN: Idelle Crouch, MD  DATE OF DISCHARGE: 06/10/2017  PRIMARY CARE PHYSICIAN: Glendon Axe, MD    ADMISSION DIAGNOSIS:  Acute blood loss anemia [D62] Weakness [R53.1] Elevated troponin I level [R74.8]  DISCHARGE DIAGNOSIS:  Principal Problem:   Symptomatic anemia Active Problems:   Colonic mass   GI bleed   Elevated troponin   Acute blood loss anemia   Palliative care encounter   Goals of care, counseling/discussion   DNR (do not resuscitate) discussion   Weakness   SECONDARY DIAGNOSIS:   Past Medical History:  Diagnosis Date  . Asthma   . Depression   . Hypertension   . Memory deficit     HOSPITAL COURSE:   1.  Metastatic colon cancer to liver.  Patient is not a surgical candidate at this time.  Patient is not a  chemotherapy candidate at this time.  Patient is seen by palliative care.  Family interested in comfort measures at this time.  Patient will go out to rehab with palliative care following.  Patient is a DNR.  Conversion to hospice if the patient declines. 2.  Symptomatic anemia.  The patient will get a second iron infusion today.  Another unit of blood today on a hemoglobin of 7.8.  No further hemoglobins.  Can start oral iron as outpatient. 3.  Elevated troponin demand ischemia from symptomatic anemia 4.  Depression on Zoloft 5.  Need to avoid constipation in a patient with a colon mass.  Iron can make the patient constipated.  Prescribed Colace.  Can add lactulose if has not gone to the bathroom in a couple days. 6.  Palliative care to follow at facility with conversion to hospice if the patient declines.  DISCHARGE CONDITIONS:   Failure.  Likely will decline pretty quickly.  CONSULTS OBTAINED:  Treatment Team:  Teodoro Spray, MD Yolonda Kida, MD Lequita Asal, MD  DRUG ALLERGIES:   Allergies  Allergen Reactions  . Aspirin Nausea And Vomiting  . Motrin [Ibuprofen] Nausea And Vomiting    DISCHARGE MEDICATIONS:   Current Discharge Medication List    START taking these medications   Details  acetaminophen (TYLENOL) 325 MG tablet Take 2 tablets (650 mg total) by mouth every 6 (six) hours as needed for mild pain (or Fever >/= 101). Qty: 60 tablet, Refills: 0    docusate sodium (COLACE) 100 MG capsule Take 1 capsule (100 mg total) by mouth 2 (two) times daily. Qty: 60 capsule, Refills: 0    feeding supplement, ENSURE ENLIVE, (ENSURE ENLIVE) LIQD Take 237 mLs by mouth 2 (two) times daily between meals. Qty: 60 Bottle, Refills: 0    ferrous sulfate 325 (65 FE) MG tablet Take 1 tablet (325 mg total) by mouth 2 (two) times daily with a meal. Qty: 60 tablet, Refills: 0    metoprolol tartrate (LOPRESSOR) 25 MG tablet Take 1 tablet (25 mg total) by mouth 2 (two) times daily. Qty: 60 tablet, Refills: 0    Morphine Sulfate (MORPHINE CONCENTRATE) 10 MG/0.5ML SOLN concentrated solution Take 0.25 mLs (5 mg total) by mouth every 2 (two) hours as needed for severe pain or shortness of breath. Qty: 30 mL, Refills: 0    ondansetron (ZOFRAN) 4 MG tablet Take 1 tablet (4 mg total) by mouth every 6 (six) hours  as needed for nausea. Qty: 20 tablet, Refills: 0      CONTINUE these medications which have NOT CHANGED   Details  albuterol (PROVENTIL HFA;VENTOLIN HFA) 108 (90 Base) MCG/ACT inhaler Inhale 2 puffs into the lungs every 4 (four) hours as needed.    Associated Diagnoses: Mass of colon    pantoprazole (PROTONIX) 40 MG tablet Take 40 mg by mouth daily.    Associated Diagnoses: Mass of colon    sertraline (ZOLOFT) 50 MG tablet Take 50 mg by mouth daily.    vitamin B-12 (CYANOCOBALAMIN) 500 MCG tablet Take 500 mcg by mouth daily.      STOP taking these medications     pravastatin (PRAVACHOL) 40 MG tablet      traMADol (ULTRAM) 50  MG tablet          DISCHARGE INSTRUCTIONS:   Follow-up with Dr. rehab 1 day  If you experience worsening of your admission symptoms, develop shortness of breath, life threatening emergency, suicidal or homicidal thoughts you must seek medical attention immediately by calling 911 or calling your MD immediately  if symptoms less severe.  You Must read complete instructions/literature along with all the possible adverse reactions/side effects for all the Medicines you take and that have been prescribed to you. Take any new Medicines after you have completely understood and accept all the possible adverse reactions/side effects.   Please note  You were cared for by a hospitalist during your hospital stay. If you have any questions about your discharge medications or the care you received while you were in the hospital after you are discharged, you can call the unit and asked to speak with the hospitalist on call if the hospitalist that took care of you is not available. Once you are discharged, your primary care physician will handle any further medical issues. Please note that NO REFILLS for any discharge medications will be authorized once you are discharged, as it is imperative that you return to your primary care physician (or establish a relationship with a primary care physician if you do not have one) for your aftercare needs so that they can reassess your need for medications and monitor your lab values.    Today   CHIEF COMPLAINT:   Chief Complaint  Patient presents with  . Failure To Thrive    HISTORY OF PRESENT ILLNESS:  [name]  is a 83 y.o. female with a known history of colon mass diagnosed back in 2013.  They declined surgery at that time.  Now it has progressed to metastatic colon cancer.   VITAL SIGNS:  Blood pressure (!) 152/70, pulse 76, temperature 98 F (36.7 C), temperature source Oral, resp. rate 19, height 5\' 2"  (1.575 m), weight 58.5 kg (129 lb), SpO2 99  %.    PHYSICAL EXAMINATION:  GENERAL:  83 y.o.-year-old patient lying in the bed with no acute distress.  EYES: Pupils equal, round, reactive to light and accommodation. No scleral icterus. Extraocular muscles intact.  HEENT: Head atraumatic, normocephalic. Oropharynx and nasopharynx clear.  NECK:  Supple, no jugular venous distention. No thyroid enlargement, no tenderness.  LUNGS: Normal breath sounds bilaterally, no wheezing, rales,rhonchi or crepitation. No use of accessory muscles of respiration.  CARDIOVASCULAR: S1, S2 normal. No murmurs, rubs, or gallops.  ABDOMEN: Soft, non-tender, non-distended. Bowel sounds present. No organomegaly or mass.  EXTREMITIES: No pedal edema, cyanosis, or clubbing.  NEUROLOGIC: Cranial nerves II through XII are intact. Muscle strength 5/5 in all extremities. Sensation intact. Gait not  checked.  PSYCHIATRIC: The patient is alert and oriented x 3.  SKIN: No obvious rash, lesion, or ulcer.   DATA REVIEW:   CBC Recent Labs  Lab 06/08/17 0510 06/10/17 0540  WBC 9.4  --   HGB 8.0* 7.8*  HCT 28.8*  --   PLT 237  --     Chemistries  Recent Labs  Lab 06/07/17 0412  06/10/17 0540  NA 135  --   --   K 4.6  --   --   CL 107  --   --   CO2 22  --   --   GLUCOSE 81  --   --   BUN 22*  --   --   CREATININE 1.29*  --   --   CALCIUM 9.0  --   --   MG  --    < > 1.9  AST 43*  --   --   ALT 21  --   --   ALKPHOS 109  --   --   BILITOT 1.6*  --   --    < > = values in this interval not displayed.    Cardiac Enzymes Recent Labs  Lab 06/07/17 0940  TROPONINI 0.12*    Microbiology Results  Results for orders placed or performed in visit on 10/13/16  Microscopic Examination     Status: Abnormal   Collection Time: 10/13/16 10:48 AM  Result Value Ref Range Status   WBC, UA 6-10 (A) 0 - 5 /hpf Final   RBC, UA >30 (H) 0 - 2 /hpf Final   Epithelial Cells (non renal) 0-10 0 - 10 /hpf Final   Mucus, UA Present (A) Not Estab. Final   Bacteria,  UA Many (A) None seen/Few Final   Yeast, UA Present (A) None seen Final    RADIOLOGY:  Ct Chest W Contrast  Result Date: 06/09/2017 CLINICAL DATA:  Ascending colon tumor, colon cancer restaging. EXAM: CT CHEST, ABDOMEN, AND PELVIS WITH CONTRAST TECHNIQUE: Multidetector CT imaging of the chest, abdomen and pelvis was performed following the standard protocol during bolus administration of intravenous contrast. CONTRAST:  81mL ISOVUE-300 IOPAMIDOL (ISOVUE-300) INJECTION 61% COMPARISON:  COMPARISON Multiple exams, including CT abdomen 11/20/2016 FINDINGS: CT CHEST FINDINGS Cardiovascular: Coronary, aortic arch, and branch vessel atherosclerotic vascular disease. Mild cardiomegaly. Mediastinum/Nodes: No pathologic adenopathy is observed in the chest. Lungs/Pleura: Small right and trace left pleural effusions with associated passive atelectasis. Mild dependent atelectasis in the left upper lobe. Musculoskeletal: Severe degenerative glenohumeral arthropathy bilaterally. Thoracic kyphosis with age-indeterminate compression fractures at T3 and T4 (likely late subacute given vertebral sclerosis) and chronic appearing inferior endplate compression fracture at T8. CT ABDOMEN PELVIS FINDINGS Hepatobiliary: Multiple new and enlarging hepatic metastatic lesions are observed. The segment 6 lesion which previously measured 1.8 by 1.4 cm currently measures 7.3 by 4.9 cm on image 60/3. Numerous new masses are present including a 4.1 by 3.5 cm mass in the lateral segment left hepatic lobe on image 53/3. No biliary dilatation. There is a hemangioma in segment 2 of the liver. Pancreas: Stable borderline dorsal pancreatic duct dilatation, otherwise unremarkable. Spleen: Unremarkable Adrenals/Urinary Tract: Stable mild nodularity of the right adrenal gland without overt mass. Cortical atrophy in both kidneys. Right kidney lower pole cyst appears stable. Bilateral nonobstructive nephrolithiasis including a 1.6 cm left collecting  system calculus an adjacent 0.6 cm left collecting system calculus as well as small bilateral punctate renal calculi in the bilateral renal upper poles. No hydronephrosis or  hydroureter, or appreciable ureteral calculus. Stomach/Bowel: 6.5 cm in length circumferential ascending colon mass measuring up to 2.3 cm in single wall thickness with surrounding stranding and possible invasion into and beyond the adventitia, with adjacent tumor nodularity along the right paracolic gutter as suggested on image 73/3. Descending colon diverticulosis. Vascular/Lymphatic: Aortoiliac atherosclerotic vascular disease. No significant degree of adenopathy. Reproductive: Uterus absent.  Ovaries normal. Other: No ascites or definite omental involvement by tumor. Musculoskeletal: Moderate degenerative arthropathy of both hips. Mild levoconvex lumbar scoliosis. Lumbar degenerative disc disease with lower lumbar spondylosis. Grade 1 degenerative retrolisthesis at L1-2 and grade 1 degenerative anterolisthesis at L4-5. IMPRESSION: 1. Large ascending colon circumferential mass with local invasion of adjacent soft tissues. Multiple new and enlarging masses in the liver representing considerable new metastatic involvement. No findings of metastatic disease to the lungs, visualized skeleton, retroperitoneal/porta hepatis lymph nodes. 2. Other imaging findings of potential clinical significance: Athero coronary atherosclerosis with mild cardiomegaly. Small right and trace left pleural effusions are new. T3 and T4 compression fractures are likely late subacute, there is a chronic appearing T8 compression fracture and these compression fractures indicate osteoporosis. Hemangioma in segment 2 of the liver. Bilateral nonobstructive nephrolithiasis with a large 1.6 cm left collecting system calculus. Descending colon diverticulosis. Considerable glenohumeral and degenerative hip arthropathy. Lumbar spondylosis, degenerative disc disease, and  scoliosis. Electronically Signed   By: Van Clines M.D.   On: 06/09/2017 10:11   Ct Abdomen Pelvis W Contrast  Result Date: 06/09/2017 CLINICAL DATA:  Ascending colon tumor, colon cancer restaging. EXAM: CT CHEST, ABDOMEN, AND PELVIS WITH CONTRAST TECHNIQUE: Multidetector CT imaging of the chest, abdomen and pelvis was performed following the standard protocol during bolus administration of intravenous contrast. CONTRAST:  47mL ISOVUE-300 IOPAMIDOL (ISOVUE-300) INJECTION 61% COMPARISON:  COMPARISON Multiple exams, including CT abdomen 11/20/2016 FINDINGS: CT CHEST FINDINGS Cardiovascular: Coronary, aortic arch, and branch vessel atherosclerotic vascular disease. Mild cardiomegaly. Mediastinum/Nodes: No pathologic adenopathy is observed in the chest. Lungs/Pleura: Small right and trace left pleural effusions with associated passive atelectasis. Mild dependent atelectasis in the left upper lobe. Musculoskeletal: Severe degenerative glenohumeral arthropathy bilaterally. Thoracic kyphosis with age-indeterminate compression fractures at T3 and T4 (likely late subacute given vertebral sclerosis) and chronic appearing inferior endplate compression fracture at T8. CT ABDOMEN PELVIS FINDINGS Hepatobiliary: Multiple new and enlarging hepatic metastatic lesions are observed. The segment 6 lesion which previously measured 1.8 by 1.4 cm currently measures 7.3 by 4.9 cm on image 60/3. Numerous new masses are present including a 4.1 by 3.5 cm mass in the lateral segment left hepatic lobe on image 53/3. No biliary dilatation. There is a hemangioma in segment 2 of the liver. Pancreas: Stable borderline dorsal pancreatic duct dilatation, otherwise unremarkable. Spleen: Unremarkable Adrenals/Urinary Tract: Stable mild nodularity of the right adrenal gland without overt mass. Cortical atrophy in both kidneys. Right kidney lower pole cyst appears stable. Bilateral nonobstructive nephrolithiasis including a 1.6 cm left  collecting system calculus an adjacent 0.6 cm left collecting system calculus as well as small bilateral punctate renal calculi in the bilateral renal upper poles. No hydronephrosis or hydroureter, or appreciable ureteral calculus. Stomach/Bowel: 6.5 cm in length circumferential ascending colon mass measuring up to 2.3 cm in single wall thickness with surrounding stranding and possible invasion into and beyond the adventitia, with adjacent tumor nodularity along the right paracolic gutter as suggested on image 73/3. Descending colon diverticulosis. Vascular/Lymphatic: Aortoiliac atherosclerotic vascular disease. No significant degree of adenopathy. Reproductive: Uterus absent.  Ovaries normal. Other: No ascites  or definite omental involvement by tumor. Musculoskeletal: Moderate degenerative arthropathy of both hips. Mild levoconvex lumbar scoliosis. Lumbar degenerative disc disease with lower lumbar spondylosis. Grade 1 degenerative retrolisthesis at L1-2 and grade 1 degenerative anterolisthesis at L4-5. IMPRESSION: 1. Large ascending colon circumferential mass with local invasion of adjacent soft tissues. Multiple new and enlarging masses in the liver representing considerable new metastatic involvement. No findings of metastatic disease to the lungs, visualized skeleton, retroperitoneal/porta hepatis lymph nodes. 2. Other imaging findings of potential clinical significance: Athero coronary atherosclerosis with mild cardiomegaly. Small right and trace left pleural effusions are new. T3 and T4 compression fractures are likely late subacute, there is a chronic appearing T8 compression fracture and these compression fractures indicate osteoporosis. Hemangioma in segment 2 of the liver. Bilateral nonobstructive nephrolithiasis with a large 1.6 cm left collecting system calculus. Descending colon diverticulosis. Considerable glenohumeral and degenerative hip arthropathy. Lumbar spondylosis, degenerative disc disease,  and scoliosis. Electronically Signed   By: Van Clines M.D.   On: 06/09/2017 10:11     Management plans discussed with the patient, family and they are in agreement.  CODE STATUS:     Code Status Orders  (From admission, onward)        Start     Ordered   06/09/17 1417  Do not attempt resuscitation (DNR)  Continuous    Question Answer Comment  In the event of cardiac or respiratory ARREST Do not call a "code blue"   In the event of cardiac or respiratory ARREST Do not perform Intubation, CPR, defibrillation or ACLS   In the event of cardiac or respiratory ARREST Use medication by any route, position, wound care, and other measures to relive pain and suffering. May use oxygen, suction and manual treatment of airway obstruction as needed for comfort.   Comments nurse may pronounce      06/09/17 1416    Code Status History    Date Active Date Inactive Code Status Order ID Comments User Context   06/06/2017 15:43 06/09/2017 14:16 Full Code [id number]  Idelle Crouch, MD Inpatient      TOTAL TIME TAKING CARE OF THIS PATIENT: 36 minutes.    Loletha Grayer M.D on 06/10/2017 at 8:22 AM  Between 7am to 6pm - Pager - [phone]  After 6pm go to [email or web address] - password [email or web address] [facility]  [phone]  CC: Primary care physician; Glendon Axe, MD

## 2017-06-11 LAB — BPAM RBC
BLOOD PRODUCT EXPIRATION DATE: 201812202359
ISSUE DATE / TIME: 201811291301
Unit Type and Rh: 7300

## 2017-06-11 LAB — TYPE AND SCREEN
ABO/RH(D): B POS
Antibody Screen: NEGATIVE
Unit division: 0

## 2017-06-24 DIAGNOSIS — C787 Secondary malignant neoplasm of liver and intrahepatic bile duct: Secondary | ICD-10-CM

## 2017-06-24 DIAGNOSIS — C182 Malignant neoplasm of ascending colon: Secondary | ICD-10-CM

## 2017-07-04 IMAGING — CT CT ABD-PELV W/ CM
2 of 5 series · 16 of 46 positions shown, 18 images · IV contrast (APPLIED)
Comparison: None.

CLINICAL DATA: Blood in stool. Right colonic mass on recent
colonoscopy.

EXAM:
CT ABDOMEN AND PELVIS WITH CONTRAST
TECHNIQUE: Multidetector CT imaging of the abdomen and pelvis was performed
using the standard protocol following bolus administration of
intravenous contrast.
CONTRAST:  75mL [facility] IOPAMIDOL ([facility]) INJECTION 61%

[Series 2: axial st · axial · 0.73mm/px · z∈[-935,-545]mm · 13 of 88 slices shown, 15 images]
[im 5/88  soft-tissue]
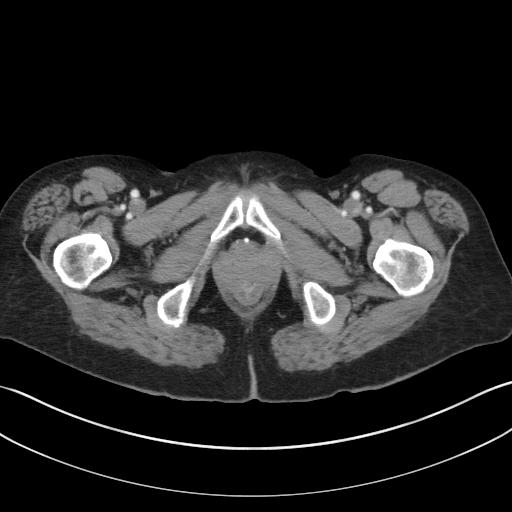
[im 5/88  bone]
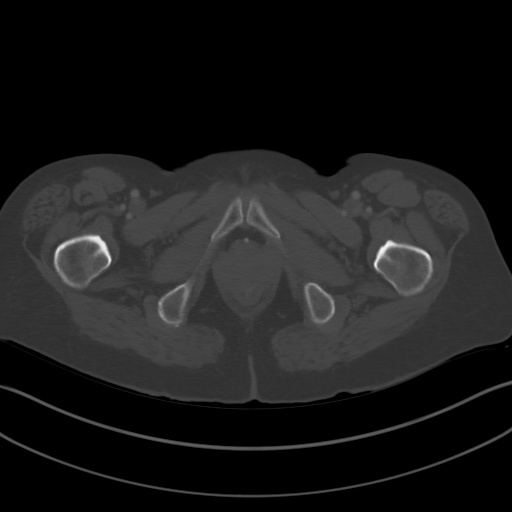
[im 14/88  soft-tissue]
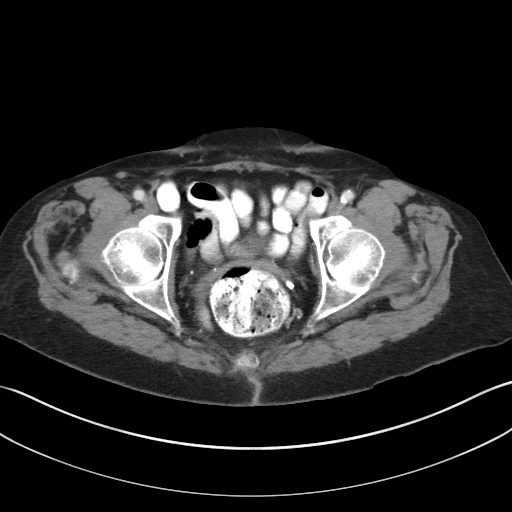
[im 18/88  soft-tissue]
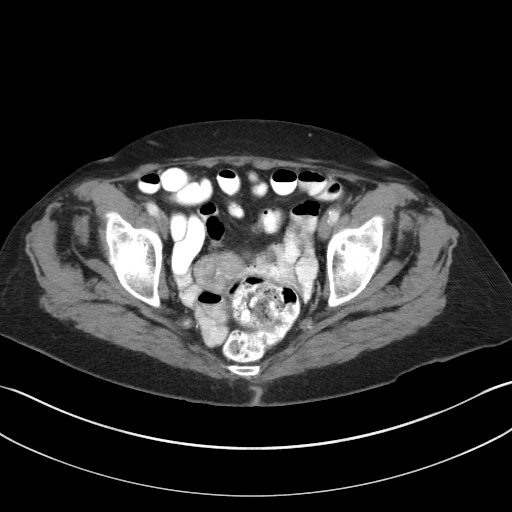
[im 27/88  soft-tissue]
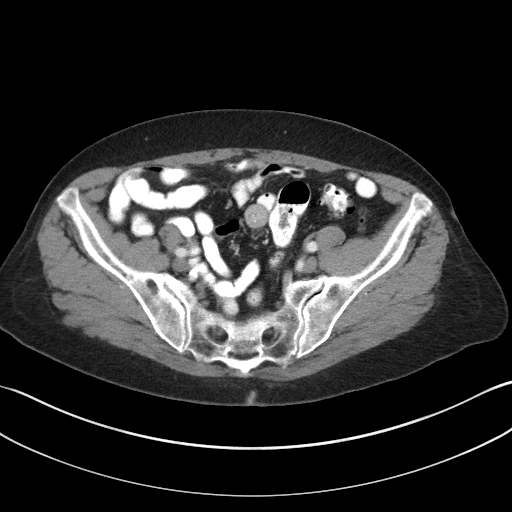
[im 31/88  soft-tissue]
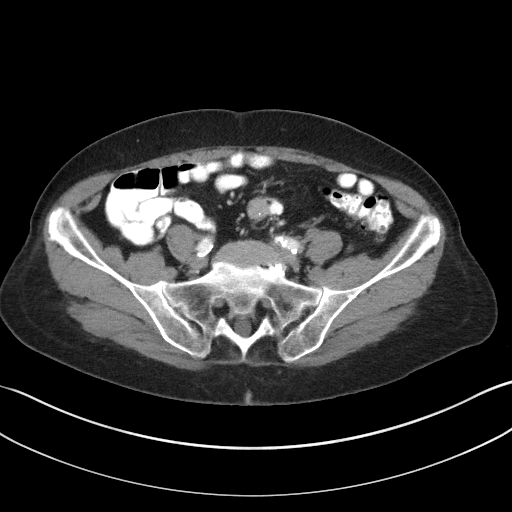
[im 40/88  soft-tissue]
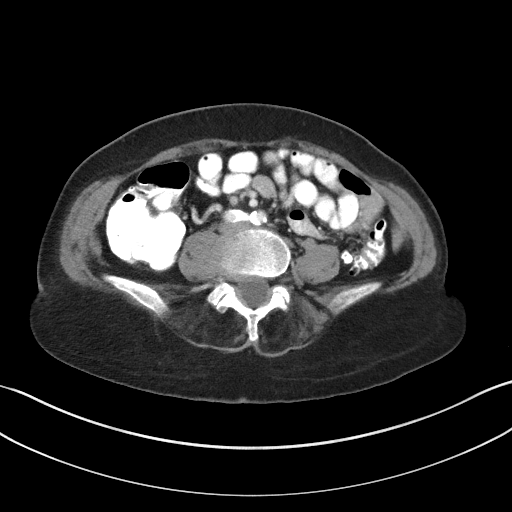
[im 44/88  soft-tissue]
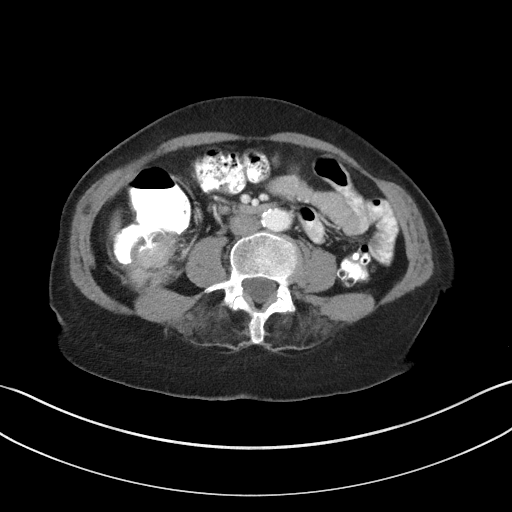
[im 48/88  soft-tissue]
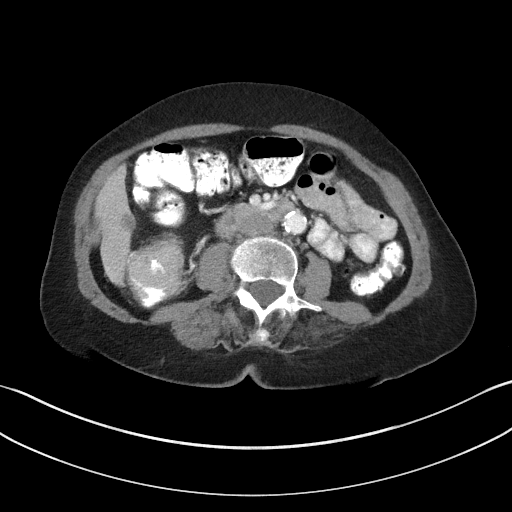
[im 57/88  soft-tissue]
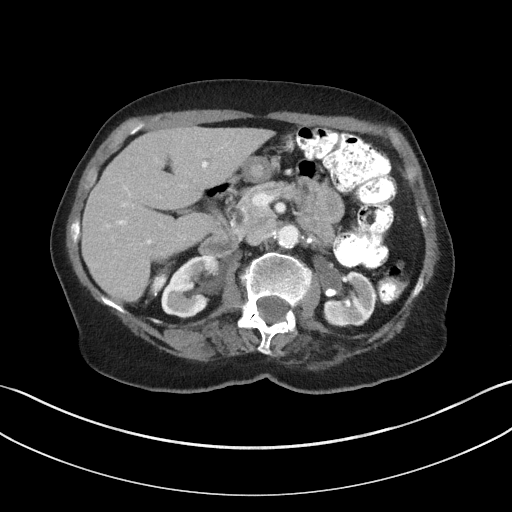
[im 57/88  bone]
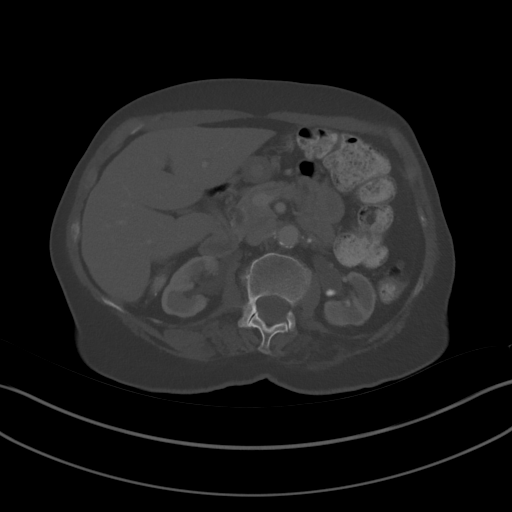
[im 61/88  soft-tissue]
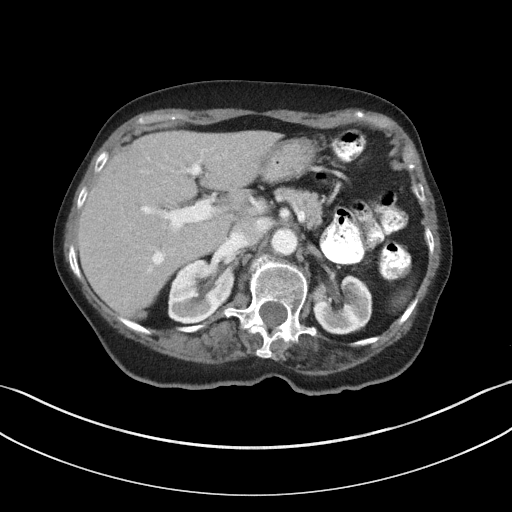
[im 70/88  soft-tissue]
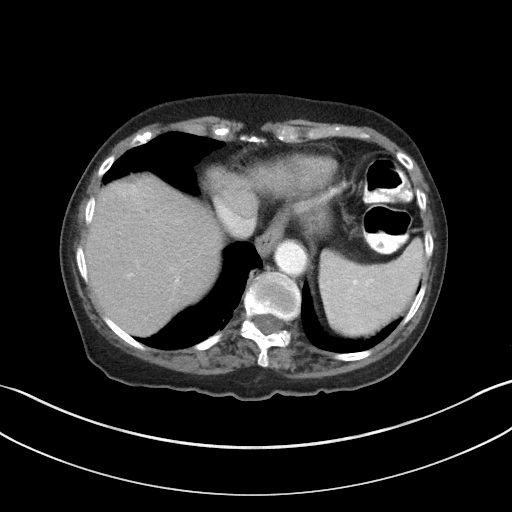
[im 74/88  soft-tissue]
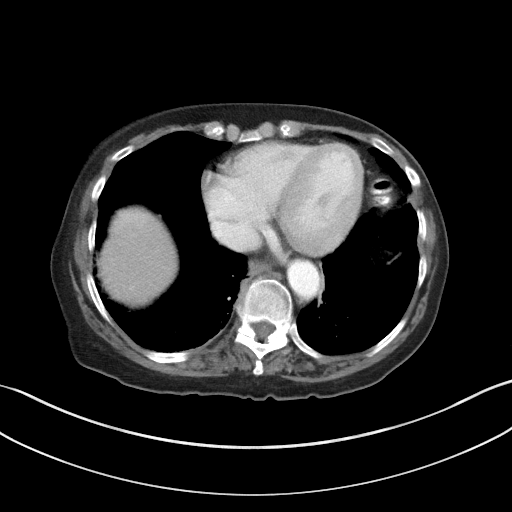
[im 83/88  soft-tissue]
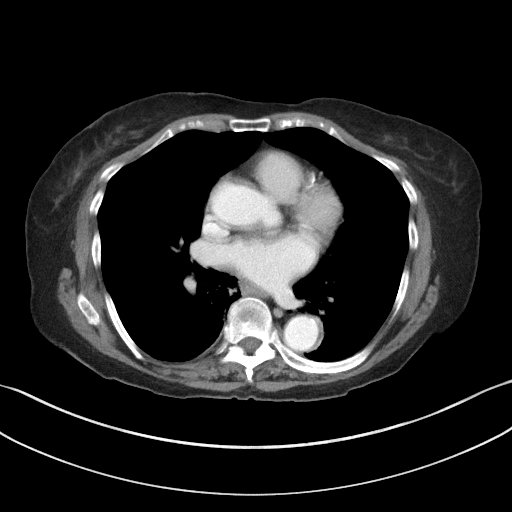

[Series 5: coronal st · coronal · 0.68mm/px · 3 of 74 slices shown]
[im 25/74  soft-tissue]
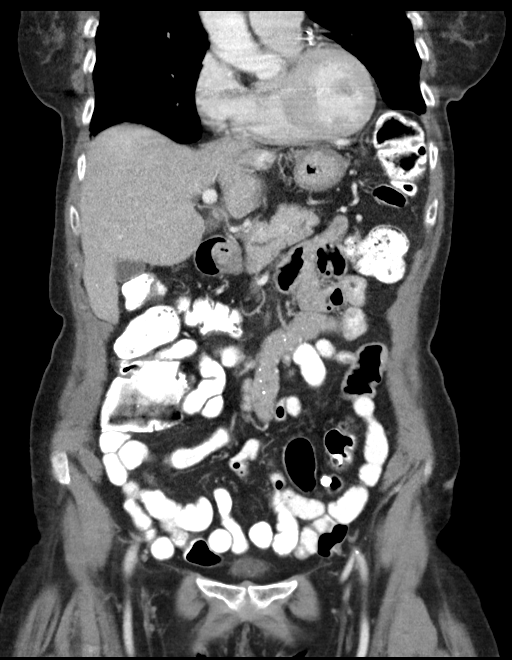
[im 33/74  soft-tissue]
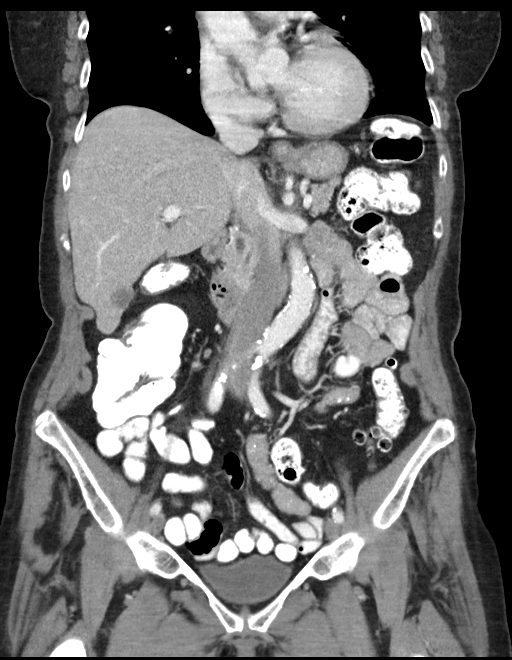
[im 41/74  soft-tissue]
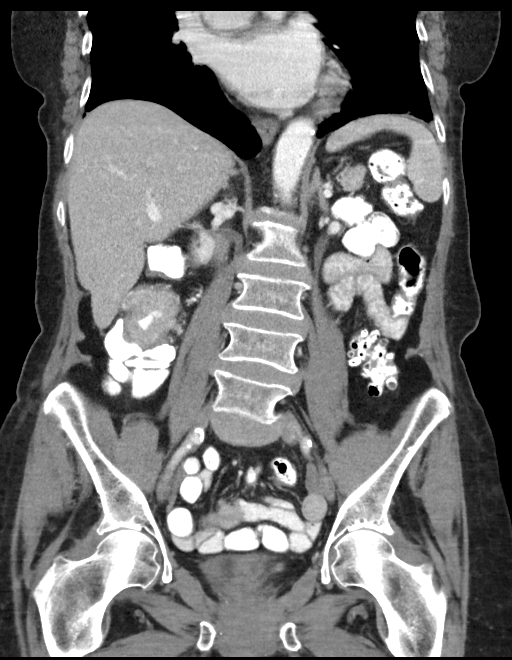

[16 of 46 positions shown; findings below may reference images not displayed]

FINDINGS: Lower Chest: No acute findings.

Hepatobiliary: A 1.8 x 1.4 cm hypovascular mass is seen in the
inferior right hepatic lobe which is new since previous study in
highly suspicious for liver metastasis. A 3.8 cm benign hemangioma
segment 2 of the left lobe remains stable. Gallbladder is
unremarkable.

Pancreas:  No mass or inflammatory changes.

Spleen: Within normal limits in size and appearance.

Adrenals/Urinary Tract: No masses identified. Small right renal
cysts again noted. Nonobstructing calculi are seen in the left
kidney, largest in the left renal pelvis measuring 11 mm. No
evidence of hydronephrosis or ureteral calculi. Unremarkable
unopacified urinary bladder.

Stomach/Bowel: An annular constricting mass is seen involving the
ascending colon which measures 3.1 x 5.1 cm. This shows extension
into perirectal fat. No evidence bowel obstruction.

Severe diverticulosis is seen involving the descending proximal
sigmoid colon, however no evidence of diverticulitis.

Vascular/Lymphatic: No pathologically enlarged lymph nodes. No
abdominal aortic aneurysm. Aortic atherosclerosis.

Reproductive: Prior hysterectomy noted. Adnexal regions are
unremarkable in appearance.

Other:  None.

Musculoskeletal:  No suspicious bone lesions identified.
IMPRESSION: 5 cm annular constricting mass in the ascending colon, with
extension into perirectal fat, consistent with primary colon
carcinoma.

New 1.8 cm hypovascular mass inferior right hepatic, highly
suspicious for liver metastasis. No other sites metastatic disease
identified.

Severe diverticulosis involving descending and proximal sigmoid
colon.

Nonobstructing left renal calculi.

Stable benign hemangioma in left hepatic lobe.

## 2017-07-13 DEATH — deceased

## 2018-05-12 ENCOUNTER — Ambulatory Visit: Payer: Medicare PPO
